# Patient Record
Sex: Male | Born: 1985 | Race: Black or African American | Hispanic: No | Marital: Single | Smoking: Never smoker
Health system: Southern US, Community
[De-identification: ages and names within clinical notes are randomized; demographics above are authoritative.]

## PROBLEM LIST (undated history)

## (undated) DIAGNOSIS — T7840XA Allergy, unspecified, initial encounter: Secondary | ICD-10-CM

## (undated) DIAGNOSIS — J45909 Unspecified asthma, uncomplicated: Secondary | ICD-10-CM

## (undated) DIAGNOSIS — U071 COVID-19: Secondary | ICD-10-CM

---

## 2005-03-01 ENCOUNTER — Ambulatory Visit (HOSPITAL_COMMUNITY): Admission: RE | Admit: 2005-03-01 | Discharge: 2005-03-01 | Payer: Self-pay | Admitting: Nephrology

## 2007-06-06 ENCOUNTER — Emergency Department (HOSPITAL_COMMUNITY): Admission: EM | Admit: 2007-06-06 | Discharge: 2007-06-06 | Payer: Self-pay | Admitting: Emergency Medicine

## 2008-08-05 ENCOUNTER — Emergency Department (HOSPITAL_COMMUNITY): Admission: EM | Admit: 2008-08-05 | Discharge: 2008-08-05 | Payer: Self-pay | Admitting: Emergency Medicine

## 2013-07-12 ENCOUNTER — Emergency Department (HOSPITAL_COMMUNITY): Payer: Self-pay

## 2013-07-12 ENCOUNTER — Encounter (HOSPITAL_COMMUNITY): Payer: Self-pay | Admitting: Adult Health

## 2013-07-12 ENCOUNTER — Emergency Department (HOSPITAL_COMMUNITY)
Admission: EM | Admit: 2013-07-12 | Discharge: 2013-07-12 | Disposition: A | Discharge status: Home / Self Care | Payer: Self-pay | Attending: Emergency Medicine | Admitting: Emergency Medicine

## 2013-07-12 DIAGNOSIS — J45909 Unspecified asthma, uncomplicated: Secondary | ICD-10-CM | POA: Insufficient documentation

## 2013-07-12 DIAGNOSIS — R05 Cough: Secondary | ICD-10-CM | POA: Insufficient documentation

## 2013-07-12 DIAGNOSIS — R0609 Other forms of dyspnea: Secondary | ICD-10-CM | POA: Insufficient documentation

## 2013-07-12 DIAGNOSIS — R0989 Other specified symptoms and signs involving the circulatory and respiratory systems: Secondary | ICD-10-CM | POA: Insufficient documentation

## 2013-07-12 DIAGNOSIS — R0789 Other chest pain: Secondary | ICD-10-CM | POA: Insufficient documentation

## 2013-07-12 DIAGNOSIS — J45901 Unspecified asthma with (acute) exacerbation: Secondary | ICD-10-CM

## 2013-07-12 DIAGNOSIS — R059 Cough, unspecified: Secondary | ICD-10-CM | POA: Insufficient documentation

## 2013-07-12 HISTORY — DX: Unspecified asthma, uncomplicated: J45.909

## 2013-07-12 MED ORDER — PREDNISONE 20 MG PO TABS
60.0000 mg | ORAL_TABLET | Freq: Once | ORAL | Status: AC
  Administered 2013-07-12: 60 mg via ORAL
  Filled 2013-07-12: qty 3

## 2013-07-12 MED ORDER — IPRATROPIUM BROMIDE 0.02 % IN SOLN
RESPIRATORY_TRACT | Status: AC
  Filled 2013-07-12: qty 2.5

## 2013-07-12 MED ORDER — ALBUTEROL SULFATE (5 MG/ML) 0.5% IN NEBU
INHALATION_SOLUTION | RESPIRATORY_TRACT | Status: AC
  Filled 2013-07-12: qty 0.5

## 2013-07-12 MED ORDER — IPRATROPIUM BROMIDE 0.02 % IN SOLN: 0.5000 mg | Freq: Once | RESPIRATORY_TRACT | Status: DC

## 2013-07-12 MED ORDER — PREDNISONE 20 MG PO TABS: ORAL_TABLET | ORAL | Status: DC

## 2013-07-12 MED ORDER — ALBUTEROL SULFATE (5 MG/ML) 0.5% IN NEBU
2.5000 mg | INHALATION_SOLUTION | Freq: Once | RESPIRATORY_TRACT | Status: AC
  Administered 2013-07-12: 2.5 mg via RESPIRATORY_TRACT

## 2013-07-12 MED ORDER — IPRATROPIUM BROMIDE 0.02 % IN SOLN
0.5000 mg | Freq: Once | RESPIRATORY_TRACT | Status: AC
  Administered 2013-07-12: 01:00:00 via RESPIRATORY_TRACT
  Administered 2013-07-12: 0.5 mg via RESPIRATORY_TRACT
  Filled 2013-07-12: qty 2.5

## 2013-07-12 MED ORDER — ALBUTEROL SULFATE (5 MG/ML) 0.5% IN NEBU
2.5000 mg | INHALATION_SOLUTION | Freq: Once | RESPIRATORY_TRACT | Status: AC
  Administered 2013-07-12: 2.5 mg via RESPIRATORY_TRACT
  Filled 2013-07-12: qty 0.5

## 2013-07-12 MED ORDER — ALBUTEROL SULFATE HFA 108 (90 BASE) MCG/ACT IN AERS
2.0000 | INHALATION_SPRAY | RESPIRATORY_TRACT | Status: DC | PRN
  Administered 2013-07-12: 2 via RESPIRATORY_TRACT
  Filled 2013-07-12: qty 6.7

## 2013-07-12 NOTE — ED Provider Notes (Signed)
  CSN: [id number]     Arrival date & time 07/12/13  0025 History     First MD Initiated Contact with Patient 07/12/13 0040     Chief Complaint  Patient presents with  . Asthma   (Consider location/radiation/quality/duration/timing/severity/associated sxs/prior Treatment) Patient is a 27 y.o. male presenting with asthma. The history is provided by the patient.  Asthma  He was walking outside this afternoon when he developed difficulty breathing. He's had a cough since this morning which is productive of green sputum. He denies fever, chills, sweats. There has been some tightness in his chest. Denies nausea or vomiting. He has a history of asthma and had an inhaler but ran out several weeks ago. He has really that he recently moved but at his prior home there is mold in his bedroom.  Past Medical History  Diagnosis Date  . Asthma    History reviewed. No pertinent past surgical history. History reviewed. No pertinent family history. History  Substance Use Topics  . Smoking status: Never Smoker   . Smokeless tobacco: Not on file  . Alcohol Use: No    Review of Systems  All other systems reviewed and are negative.    Allergies  Review of patient's allergies indicates no known allergies.  Home Medications  No current outpatient prescriptions on file. BP 143/96  Pulse 79  Temp(Src) 98.3 F (36.8 C) (Oral)  Resp 28  SpO2 99% Physical Exam  Nursing note and vitals reviewed.  27 year old male, resting comfortably and in no acute distress. Vital signs are significant for hypertension with blood pressure 143/96, and tachypnea with respiratory rate of 28. Oxygen saturation is 99%, which is normal. Head is normocephalic and atraumatic. PERRLA, EOMI. Oropharynx is clear. Neck is nontender and supple without adenopathy or JVD. Back is nontender and there is no CVA tenderness. Lungs have moderate diffuse wheezing without rales or rhonchi. He appears mildly uncomfortable but is not  using accessory muscles of respiration and is able to speak in complete sentences. Chest is nontender. Heart has regular rate and rhythm without murmur. Abdomen is soft, flat, nontender without masses or hepatosplenomegaly and peristalsis is normoactive. Extremities have no cyanosis or edema, full range of motion is present. Skin is warm and dry without rash. Neurologic: Mental status is normal, cranial nerves are intact, there are no motor or sensory deficits.  ED Course   Procedures (including critical care time)  Dg Chest 2 View  07/12/2013   *RADIOLOGY REPORT*  Clinical Data: Asthma attack.  CHEST - 2 VIEW  Comparison: None.  Findings: No significant osseous abnormality.  Lungs are clear. No effusion or pneumothorax.  Cardiomediastinal size and contour are within normal limits.  The upper abdomen is unremarkable.  IMPRESSION: No evidence of acute cardiopulmonary disease.   Original Report Authenticated By: Tiburcio Pea   1. Asthma attack     MDM  Acute exacerbation of asthma which is partly due to medication noncompliance. He is given a dose of prednisone. He had already been given an albuterol with ipratropium nebulizer treatment prior to my seeing him and he states that there has been some improvement with that. A second nebulizer treatment as ordered. Old records are reviewed and it has been 5 years since his last ED visit.  He feels much better after second nebulizer treatment. His discharge with prescriptions for prednisone and he is given an albuterol inhaler to take home.  Dione Booze, MD 07/12/13 0300

## 2013-07-12 NOTE — ED Notes (Signed)
Presents with inspiratory and expiratory wheezes, able to speak in full sentences. Symptoms began at 13:30 this afternoon, pt took one albuterol treatment with relief at 10 pm but symtpms have exacerbated.

## 2015-03-25 ENCOUNTER — Encounter (HOSPITAL_COMMUNITY): Payer: Self-pay | Admitting: Emergency Medicine

## 2015-03-25 ENCOUNTER — Emergency Department (HOSPITAL_COMMUNITY)
Admission: EM | Admit: 2015-03-25 | Discharge: 2015-03-25 | Disposition: A | Discharge status: Home / Self Care | Payer: Self-pay | Source: Home / Self Care | Attending: Family Medicine | Admitting: Family Medicine

## 2015-03-25 DIAGNOSIS — R109 Unspecified abdominal pain: Secondary | ICD-10-CM

## 2015-03-25 LAB — CBC
HEMATOCRIT: 40.7 % (ref 39.0–52.0)
HEMOGLOBIN: 13.6 g/dL (ref 13.0–17.0)
MCH: 31.4 pg (ref 26.0–34.0)
MCHC: 33.4 g/dL (ref 30.0–36.0)
MCV: 94 fL (ref 78.0–100.0)
Platelets: 253 10*3/uL (ref 150–400)
RBC: 4.33 MIL/uL (ref 4.22–5.81)
RDW: 12.8 % (ref 11.5–15.5)
WBC: 7.5 10*3/uL (ref 4.0–10.5)

## 2015-03-25 LAB — COMPREHENSIVE METABOLIC PANEL
ALK PHOS: 75 U/L (ref 39–117)
ALT: 19 U/L (ref 0–53)
AST: 29 U/L (ref 0–37)
Albumin: 3.8 g/dL (ref 3.5–5.2)
Anion gap: 9 (ref 5–15)
BUN: 25 mg/dL — ABNORMAL HIGH (ref 6–23)
CALCIUM: 9.2 mg/dL (ref 8.4–10.5)
CHLORIDE: 101 mmol/L (ref 96–112)
CO2: 30 mmol/L (ref 19–32)
Creatinine, Ser: 0.97 mg/dL (ref 0.50–1.35)
GFR calc Af Amer: >90 mL/min (ref >90)
GLUCOSE: 106 mg/dL — AB (ref 70–99)
POTASSIUM: 4.5 mmol/L (ref 3.5–5.1)
SODIUM: 140 mmol/L (ref 135–145)
Total Bilirubin: 0.5 mg/dL (ref 0.3–1.2)
Total Protein: 7.1 g/dL (ref 6.0–8.3)

## 2015-03-25 LAB — POCT URINALYSIS DIP (DEVICE)
BILIRUBIN URINE: NEGATIVE
GLUCOSE, UA: NEGATIVE mg/dL
Hgb urine dipstick: NEGATIVE
KETONES UR: NEGATIVE mg/dL
LEUKOCYTES UA: NEGATIVE
Nitrite: NEGATIVE
PROTEIN: NEGATIVE mg/dL
SPECIFIC GRAVITY, URINE: 1.02 (ref 1.005–1.030)
Urobilinogen, UA: 2 mg/dL — ABNORMAL HIGH (ref 0.0–1.0)
pH: 7 (ref 5.0–8.0)

## 2015-03-25 LAB — LIPASE, BLOOD: Lipase: 18 U/L (ref 11–59)

## 2015-03-25 NOTE — ED Provider Notes (Signed)
Brady Rivera is a 29 y.o. male who presents to Urgent Care today for flank pain. Patient has bilateral right worse than left side pain for the past week. 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 He does however note urinary frequency especially at night. He denies any abdominal pain fevers or chills. He denies any penile discharge or urinary urgency or dysuria. He has tried some aspirin which helps a little. He feels well otherwise. He denies any injury. He notes the pain radiates to his thoracic posterior and lateral chest wall and is worse with arm motion.   Past Medical History  Diagnosis Date  . Asthma    History reviewed. No pertinent past surgical history. History  Substance Use Topics  . Smoking status: Never Smoker   . Smokeless tobacco: Not on file  . Alcohol Use: No   ROS as above Medications: No current facility-administered medications for this encounter.   Current Outpatient Prescriptions  Medication Sig Dispense Refill  . cetirizine (ZYRTEC) 10 MG tablet Take 10 mg by mouth daily.    . predniSONE (DELTASONE) 20 MG tablet 3 tabs po daily (Patient not taking: Reported on 03/25/2015) 15 tablet 0   No Known Allergies   Exam:  BP 106/84 mmHg  Pulse 70  Temp(Src) 98.1 F (36.7 C) (Oral)  Resp 16  SpO2 100% Gen: Well NAD HEENT: EOMI,  MMM Lungs: Normal work of breathing. CTABL Heart: RRR no MRG Chest wall is nontender Abd: NABS, Soft. Nondistended, Nontender no rebound or guarding. No CV angle tenderness to percussion Exts: Brisk capillary refill, warm and well perfused.   Results for orders placed or performed during the hospital encounter of 03/25/15 (from the past 24 hour(s))  POCT urinalysis dip (device)     Status: Abnormal   Collection Time: 03/25/15  8:36 AM  Result Value Ref Range   Glucose, UA NEGATIVE NEGATIVE mg/dL   Bilirubin Urine NEGATIVE NEGATIVE   Ketones, ur NEGATIVE NEGATIVE mg/dL   Specific Gravity, Urine 1.020 1.005 - 1.030    Hgb urine dipstick NEGATIVE NEGATIVE   pH 7.0 5.0 - 8.0   Protein, ur NEGATIVE NEGATIVE mg/dL   Urobilinogen, UA 2.0 (H) 0.0 - 1.0 mg/dL   Nitrite NEGATIVE NEGATIVE   Leukocytes, UA NEGATIVE NEGATIVE  CBC     Status: None   Collection Time: 03/25/15  9:20 AM  Result Value Ref Range   WBC 7.5 4.0 - 10.5 K/uL   RBC 4.33 4.22 - 5.81 MIL/uL   Hemoglobin 13.6 13.0 - 17.0 g/dL   HCT 16.1 09.6 - 04.5 %   MCV 94.0 78.0 - 100.0 fL   MCH 31.4 26.0 - 34.0 pg   MCHC 33.4 30.0 - 36.0 g/dL   RDW 40.9 81.1 - 91.4 %   Platelets 253 150 - 400 K/uL  Comprehensive metabolic panel     Status: Abnormal   Collection Time: 03/25/15  9:20 AM  Result Value Ref Range   Sodium 140 135 - 145 mmol/L   Potassium 4.5 3.5 - 5.1 mmol/L   Chloride 101 96 - 112 mmol/L   CO2 30 19 - 32 mmol/L   Glucose, Bld 106 (H) 70 - 99 mg/dL   BUN 25 (H) 6 - 23 mg/dL   Creatinine, Ser 7.82 0.50 - 1.35 mg/dL   Calcium 9.2 8.4 - 95.6 mg/dL   Total Protein 7.1 6.0 - 8.3 g/dL   Albumin 3.8 3.5 - 5.2 g/dL   AST 29  0 - 37 U/L   ALT 19 0 - 53 U/L   Alkaline Phosphatase 75 39 - 117 U/L   Total Bilirubin 0.5 0.3 - 1.2 mg/dL   GFR calc non Af Amer >90 >90 mL/min   GFR calc Af Amer >90 >90 mL/min   Anion gap 9 5 - 15  Lipase, blood     Status: None   Collection Time: 03/25/15  9:20 AM  Result Value Ref Range   Lipase 18 11 - 59 U/L   No results found.  Assessment and Plan: 29 y.o. male with probable strain of the anterior serratus bilaterally with start of a new job. However patient does have mild increase of urobilinogen on urinalysis. I am quite doubtful of serious abdominal pathology however will obtain a CBC, CMP, and lipase. Patient was discharged prior to labs being back. I have called his cell phone twice and left a message asking for call back.  Will call in voltaren and flexeril.    Discussed warning signs or symptoms. Please see discharge instructions. Patient expresses understanding.     Rodolph BongEvan S Corey,  MD 03/25/15 1059

## 2015-03-25 NOTE — Discharge Instructions (Signed)
Thank you for coming in today. I will call you in a few hours with test results and the plan.  If your belly pain worsens, or you have high fever, bad vomiting, blood in your stool or black tarry stool go to the Emergency Room.   Muscle Strain A muscle strain is an injury that occurs when a muscle is stretched beyond its normal length. Usually a small number of muscle fibers are torn when this happens. Muscle strain is rated in degrees. First-degree strains have the least amount of muscle fiber tearing and pain. Second-degree and third-degree strains have increasingly more tearing and pain.  Usually, recovery from muscle strain takes 1-2 weeks. Complete healing takes 5-6 weeks.  CAUSES  Muscle strain happens when a sudden, violent force placed on a muscle stretches it too far. This may occur with lifting, sports, or a fall.  RISK FACTORS Muscle strain is especially common in athletes.  SIGNS AND SYMPTOMS At the site of the muscle strain, there may be:  Pain.  Bruising.  Swelling.  Difficulty using the muscle due to pain or lack of normal function. DIAGNOSIS  Your health care provider will perform a physical exam and ask about your medical history. TREATMENT  Often, the best treatment for a muscle strain is resting, icing, and applying cold compresses to the injured area.  HOME CARE INSTRUCTIONS   Use the PRICE method of treatment to promote muscle healing during the first 2-3 days after your injury. The PRICE method involves:  Protecting the muscle from being injured again.  Restricting your activity and resting the injured body part.  Icing your injury. To do this, put ice in a plastic bag. Place a towel between your skin and the bag. Then, apply the ice and leave it on from 15-20 minutes each hour. After the third day, switch to moist heat packs.  Apply compression to the injured area with a splint or elastic bandage. Be careful not to wrap it too tightly. This may interfere  with blood circulation or increase swelling.  Elevate the injured body part above the level of your heart as often as you can.  Only take over-the-counter or prescription medicines for pain, discomfort, or fever as directed by your health care provider.  Warming up prior to exercise helps to prevent future muscle strains. SEEK MEDICAL CARE IF:   You have increasing pain or swelling in the injured area.  You have numbness, tingling, or a significant loss of strength in the injured area. MAKE SURE YOU:   Understand these instructions.  Will watch your condition.  Will get help right away if you are not doing well or get worse. Document Released: 11/27/2005 Document Revised: 09/17/2013 Document Reviewed: 06/26/2013 [place] [facility] Patient Information 2015 [place], [place]. This information is not intended to replace advice given to you by your health care provider. Make sure you discuss any questions you have with your health care provider.

## 2015-03-25 NOTE — ED Notes (Signed)
Bilateral flank pain for a week, pain is greater on right side than left side.  Describes pain as sharp.  Reports frequent urination, denies penile discharge, denies pain with urination.

## 2015-03-26 ENCOUNTER — Telehealth (HOSPITAL_COMMUNITY): Payer: Self-pay | Admitting: Family Medicine

## 2015-03-26 MED ORDER — CYCLOBENZAPRINE HCL 10 MG PO TABS: 10.0000 mg | ORAL_TABLET | Freq: Every evening | ORAL | Status: DC | PRN

## 2015-03-26 MED ORDER — DICLOFENAC SODIUM 75 MG PO TBEC: 75.0000 mg | DELAYED_RELEASE_TABLET | Freq: Two times a day (BID) | ORAL | Status: DC | PRN

## 2015-03-26 NOTE — ED Notes (Signed)
Patient called back.  I gave results.  Rx sent in.   Rodolph BongEvan S Jimy Gates, MD 03/26/15 [id number]

## 2015-04-22 ENCOUNTER — Emergency Department (HOSPITAL_COMMUNITY): Payer: Worker's Compensation

## 2015-04-22 ENCOUNTER — Emergency Department (HOSPITAL_COMMUNITY)
Admission: EM | Admit: 2015-04-22 | Discharge: 2015-04-22 | Disposition: A | Discharge status: Home / Self Care | Payer: Worker's Compensation | Attending: Emergency Medicine | Admitting: Emergency Medicine

## 2015-04-22 ENCOUNTER — Encounter (HOSPITAL_COMMUNITY): Payer: Self-pay | Admitting: Emergency Medicine

## 2015-04-22 DIAGNOSIS — Y939 Activity, unspecified: Secondary | ICD-10-CM | POA: Insufficient documentation

## 2015-04-22 DIAGNOSIS — J45909 Unspecified asthma, uncomplicated: Secondary | ICD-10-CM | POA: Insufficient documentation

## 2015-04-22 DIAGNOSIS — S8992XA Unspecified injury of left lower leg, initial encounter: Secondary | ICD-10-CM | POA: Diagnosis present

## 2015-04-22 DIAGNOSIS — Y929 Unspecified place or not applicable: Secondary | ICD-10-CM | POA: Insufficient documentation

## 2015-04-22 DIAGNOSIS — Y990 Civilian activity done for income or pay: Secondary | ICD-10-CM | POA: Insufficient documentation

## 2015-04-22 DIAGNOSIS — W010XXA Fall on same level from slipping, tripping and stumbling without subsequent striking against object, initial encounter: Secondary | ICD-10-CM | POA: Insufficient documentation

## 2015-04-22 DIAGNOSIS — Z79899 Other long term (current) drug therapy: Secondary | ICD-10-CM | POA: Diagnosis not present

## 2015-04-22 DIAGNOSIS — M25562 Pain in left knee: Secondary | ICD-10-CM

## 2015-04-22 MED ORDER — HYDROCODONE-ACETAMINOPHEN 5-325 MG PO TABS: 1.0000 | ORAL_TABLET | Freq: Four times a day (QID) | ORAL | Status: DC | PRN

## 2015-04-22 NOTE — ED Notes (Signed)
Pt st's he slipped and fell at work landing on left knee approx 45 min. Ago.  C/o pain to left knee

## 2015-04-22 NOTE — Discharge Instructions (Signed)
Crutch Use Crutches take weight off one of your legs or feet when you stand or walk. It is important to use crutches that fit right. Your crutches fit right if:  You can fit 2-3 fingers between your armpit and the crutch.  You use your hands, not your armpits, to hold yourself up. Do not put your armpits on the crutches. This can damage the nerves in your hands and arms. Crutches should be a little below your armpits. HOW TO USE YOUR CRUTCHES Walking 1. Step with the crutches. 2. Swing the good leg a little bit in front of the crutches. Going Up Steps If there is no handrail: 1. Step up with the good leg. 2. Step up with the crutches and hurt leg. 3. Continue in this way. If there is a handrail: 1. Hold both crutches in one hand. 2. Place your free hand on the handrail. 3. Put your weight on your arms and lift your good leg to the step. 4. Bring the crutches and the hurt leg up to that step. 5. Continue in this way. Going Down Steps Be very careful, as going down stairs with crutches is very challenging. If there is no handrail: 1. Step down with the hurt leg and crutches. 2. Step down with the good leg. If there is a handrail: 1. Place your hand on the handrail. 2. Hold both crutches with your free hand. 3. Lower your hurt leg and crutch to the step below you. Make sure to keep the crutch tips in the center of the step, never on the edge. 4. Lower your good leg to that step. 5. Continue in this way. Standing Up 1. Hold the hurt leg forward. 2. Grab the armrest with one hand and the top of the crutches with the other hand. 3. Pull yourself up to a standing position. Sitting Down 1. Hold the hurt leg forward. 2. Grab the armrest with one hand and the top of the crutches with the other hand. 3.  Lower yourself to a sitting position. GET HELP IF:  You still feel wobbly on your feet.  You develop new pain, for example in your armpits, back, shoulder, wrist, or hip.  You  cannot feel a part of your body (numb).  You have tingling. GET HELP RIGHT AWAY IF: You fall. Document Released: 05/15/2008 Document Revised: 09/17/2013 Document Reviewed: 08/04/2013 [facility] CenterExitCare Patient Information 2015 [place], [place]. This information is not intended to replace advice given to you by your health care provider. Make sure you discuss any questions you have with your health care provider. Knee Pain The knee is the complex joint between your thigh and your lower leg. It is made up of bones, tendons, ligaments, and cartilage. The bones that make up the knee are:  The femur in the thigh.  The tibia and fibula in the lower leg.  The patella or kneecap riding in the groove on the lower femur. CAUSES  Knee pain is a common complaint with many causes. A few of these causes are: 3. Injury, such as: 1. A ruptured ligament or tendon injury. 2. Torn cartilage. 4. Medical conditions, such as: 1. Gout 2. Arthritis 3. Infections 5. Overuse, over training, or overdoing a physical activity. Knee pain can be minor or severe. Knee pain can accompany debilitating injury. Minor knee problems often respond well to self-care measures or get well on their own. More serious injuries may need medical intervention or even surgery. SYMPTOMS The knee is complex. Symptoms of knee  problems can vary widely. Some of the problems are: 4. Pain with movement and weight bearing. 5. Swelling and tenderness. 6. Buckling of the knee. 7. Inability to straighten or extend your knee. 8. Your knee locks and you cannot straighten it. 9. Warmth and redness with pain and fever. 10. Deformity or dislocation of the kneecap. DIAGNOSIS  Determining what is wrong may be very straight forward such as when there is an injury. It can also be challenging because of the complexity of the knee. Tests to make a diagnosis may include: 6. Your caregiver taking a history and doing a physical exam. 7. Routine X-rays can be  used to rule out other problems. X-rays will not reveal a cartilage tear. Some injuries of the knee can be diagnosed by: 1. Arthroscopy a surgical technique by which a small video camera is inserted through tiny incisions on the sides of the knee. This procedure is used to examine and repair internal knee joint problems. Tiny instruments can be used during arthroscopy to repair the torn knee cartilage (meniscus). 2. Arthrography is a radiology technique. A contrast liquid is directly injected into the knee joint. Internal structures of the knee joint then become visible on X-ray film. 3. An MRI scan is a non X-ray radiology procedure in which magnetic fields and a computer produce two- or three-dimensional images of the inside of the knee. Cartilage tears are often visible using an MRI scanner. MRI scans have largely replaced arthrography in diagnosing cartilage tears of the knee. 8. Blood work. 9. Examination of the fluid that helps to lubricate the knee joint (synovial fluid). This is done by taking a sample out using a needle and a syringe. TREATMENT The treatment of knee problems depends on the cause. Some of these treatments are: 3. Depending on the injury, proper casting, splinting, surgery, or physical therapy care will be needed. 4. Give yourself adequate recovery time. Do not overuse your joints. If you begin to get sore during workout routines, back off. Slow down or do fewer repetitions. 5. For repetitive activities such as cycling or running, maintain your strength and nutrition. 6. Alternate muscle groups. For example, if you are a weight lifter, work the upper body on one day and the lower body the next. 7. Either tight or weak muscles do not give the proper support for your knee. Tight or weak muscles do not absorb the stress placed on the knee joint. Keep the muscles surrounding the knee strong. 8. Take care of mechanical problems. 1. If you have flat feet, orthotics or special shoes  may help. See your caregiver if you need help. 2. Arch supports, sometimes with wedges on the inner or outer aspect of the heel, can help. These can shift pressure away from the side of the knee most bothered by osteoarthritis. 3. A brace called an "unloader" brace also may be used to help ease the pressure on the most arthritic side of the knee. 9. If your caregiver has prescribed crutches, braces, wraps or ice, use as directed. The acronym for this is PRICE. This means protection, rest, ice, compression, and elevation. 10. Nonsteroidal anti-inflammatory drugs (NSAIDs), can help relieve pain. But if taken immediately after an injury, they may actually increase swelling. Take NSAIDs with food in your stomach. Stop them if you develop stomach problems. Do not take these if you have a history of ulcers, stomach pain, or bleeding from the bowel. Do not take without your caregiver's approval if you have problems with fluid  retention, heart failure, or kidney problems. 11. For ongoing knee problems, physical therapy may be helpful. 12. Glucosamine and chondroitin are over-the-counter dietary supplements. Both may help relieve the pain of osteoarthritis in the knee. These medicines are different from the usual anti-inflammatory drugs. Glucosamine may decrease the rate of cartilage destruction. 13. Injections of a corticosteroid drug into your knee joint may help reduce the symptoms of an arthritis flare-up. They may provide pain relief that lasts a few months. You may have to wait a few months between injections. The injections do have a small increased risk of infection, water retention, and elevated blood sugar levels. 14. Hyaluronic acid injected into damaged joints may ease pain and provide lubrication. These injections may work by reducing inflammation. A series of shots may give relief for as long as 6 months. 15. Topical painkillers. Applying certain ointments to your skin may help relieve the pain and  stiffness of osteoarthritis. Ask your pharmacist for suggestions. Many over the-counter products are approved for temporary relief of arthritis pain. 16. In some countries, doctors often prescribe topical NSAIDs for relief of chronic conditions such as arthritis and tendinitis. A review of treatment with NSAID creams found that they worked as well as oral medications but without the serious side effects. PREVENTION 6. Maintain a healthy weight. Extra pounds put more strain on your joints. 7. Get strong, stay limber. Weak muscles are a common cause of knee injuries. Stretching is important. Include flexibility exercises in your workouts. 8. Be smart about exercise. If you have osteoarthritis, chronic knee pain or recurring injuries, you may need to change the way you exercise. This does not mean you have to stop being active. If your knees ache after jogging or playing basketball, consider switching to swimming, water aerobics, or other low-impact activities, at least for a few days a week. Sometimes limiting high-impact activities will provide relief. 9. Make sure your shoes fit well. Choose footwear that is right for your sport. 10. Protect your knees. Use the proper gear for knee-sensitive activities. Use kneepads when playing volleyball or laying carpet. Buckle your seat belt every time you drive. Most shattered kneecaps occur in car accidents. 11. Rest when you are tired. SEEK MEDICAL CARE IF:  You have knee pain that is continual and does not seem to be getting better.  SEEK IMMEDIATE MEDICAL CARE IF:  Your knee joint feels hot to the touch and you have a high fever. MAKE SURE YOU:  4. Understand these instructions. 5. Will watch your condition. 6. Will get help right away if you are not doing well or get worse. Document Released: 09/24/2007 Document Revised: 02/19/2012 Document Reviewed: 09/24/2007 [facility] LLCExitCare Patient Information 2015 [place]ExitCare, [place]LLC. This information is not intended to replace  advice given to you by your health care provider. Make sure you discuss any questions you have with your health care provider.

## 2015-04-22 NOTE — ED Provider Notes (Signed)
CSN: [id number]     Arrival date & time 04/22/15  1900 History  This chart was scribed for non-physician practitioner, Felicie Mornavid Bernardina Cacho, NP, working with Linwood DibblesJon Knapp, MD, by Bronson CurbJacqueline Melvin, ED Scribe. This patient was seen in room TR06C/TR06C and the patient's care was started at 7:26 PM.     Chief Complaint  Patient presents with  . Knee Pain    Patient is a 29 y.o. male presenting with knee pain. The history is provided by the patient. No language interpreter was used.  Knee Pain Location:  Knee Time since incident:  45 minutes Injury: no   Knee location:  L knee Pain details:    Radiates to:  Does not radiate   Pain severity now: 3/10.   Onset quality:  Sudden   Duration: 45 minutes.   Timing:  Constant   Progression:  Unchanged Chronicity:  New Prior injury to area:  Yes Relieved by:  None tried Exacerbated by: applying pressure. Ineffective treatments:  None tried Associated symptoms: no fever      HPI Comments: Brady Rivera is a 29 y.o. male who presents to the Emergency Department complaining of sudden onset, constant, 3/10, left knee pain that began approximately 45 minutes ago. Patient states he slipped and fell at work, twisting the left knee. He reports the pain is worse with application of pressure. Patient reports history of prior dislocation of the same knee. He denies BLE pain, hip pain, or any other injuries. He further denies, fever, chills, cough, congestion, rhinorrhea, sore throat, abdominal pain, nausea, chest pain, or any other symptoms at this time.   Past Medical History  Diagnosis Date  . Asthma    History reviewed. No pertinent past surgical history. No family history on file. History  Substance Use Topics  . Smoking status: Never Smoker   . Smokeless tobacco: Not on file  . Alcohol Use: No    Review of Systems  Constitutional: Negative for fever and chills.  HENT: Negative for congestion, rhinorrhea and sore throat.   Respiratory: Negative  for cough.   Cardiovascular: Negative for chest pain.  Gastrointestinal: Negative for abdominal pain.  Musculoskeletal: Positive for arthralgias.  All other systems reviewed and are negative.     Allergies  Review of patient's allergies indicates no known allergies.  Home Medications   Prior to Admission medications   Medication Sig Start Date End Date Taking? Authorizing Provider  cetirizine (ZYRTEC) 10 MG tablet Take 10 mg by mouth daily.    Historical Provider, MD  cyclobenzaprine (FLEXERIL) 10 MG tablet Take 1 tablet (10 mg total) by mouth at bedtime as needed for muscle spasms. 03/26/15   Rodolph BongEvan S Corey, MD  diclofenac (VOLTAREN) 75 MG EC tablet Take 1 tablet (75 mg total) by mouth 2 (two) times daily as needed. 03/26/15   Rodolph BongEvan S Corey, MD  predniSONE (DELTASONE) 20 MG tablet 3 tabs po daily Patient not taking: Reported on 03/25/2015 07/12/13   Dione Boozeavid Glick, MD   Triage Vitals: BP 133/84 mmHg  Pulse 74  Temp(Src) 98.3 F (36.8 C) (Oral)  Resp 18  Ht 5\' 10"  (1.778 m)  Wt 131 lb (59.421 kg)  BMI 18.80 kg/m2  SpO2 100%  Physical Exam  Constitutional: He is oriented to person, place, and time. He appears well-developed and well-nourished. No distress.  HENT:  Head: Normocephalic and atraumatic.  Eyes: Conjunctivae and EOM are normal.  Neck: Neck supple. No tracheal deviation present.  Cardiovascular: Normal rate.   Pulmonary/Chest: Effort  normal. No respiratory distress.  Musculoskeletal: Normal range of motion. He exhibits tenderness.  Medial tenderness to the left knee.  Neurological: He is alert and oriented to person, place, and time.  Skin: Skin is warm and dry.  Psychiatric: He has a normal mood and affect. His behavior is normal.  Nursing note and vitals reviewed.   ED Course  Procedures (including critical care time)  DIAGNOSTIC STUDIES: Oxygen Saturation is 100% on room air, normal by my interpretation.    COORDINATION OF CARE: At 1930 Discussed treatment plan  with patient which includes imaging. Patient agrees.   Labs Review Labs Reviewed - No data to display  Imaging Review Dg Knee Complete 4 Views Left  04/22/2015   CLINICAL DATA:  Slipping injury with knee pain, initial encounter  EXAM: LEFT KNEE - COMPLETE 4+ VIEW  COMPARISON:  None.  FINDINGS: Moderate joint effusion is noted. There is a triangular-shaped bony density noted just posterior to the superior aspect of the patella consistent with a focal fracture. The actual donor site is not well appreciated on these images but likely arises from the posterior aspect of the patella. No other focal abnormality is seen.  IMPRESSION: Fracture fragment posterior to the superior aspect of the patella. Moderate joint effusion is noted. The donor site is not well visualized although likely is arising from the posterior patella.   Electronically Signed   By: Alcide CleverMark  Lukens M.D.   On: 04/22/2015 20:01     EKG Interpretation None     Radiology results reviewed and shared with patient. MDM   Final diagnoses:  None    Knee pain. ? Patellar fracture, posterior origin. Rest. Ice. Anti-inflammatory. Knee immobilizer. Crutches. Follow-up with orthopedics.    I personally performed the services described in this documentation, which was scribed in my presence. The recorded information has been reviewed and is accurate.     Felicie Mornavid Esteen Delpriore, NP 04/22/15 [phone]  Linwood DibblesJon Knapp, MD 04/25/15 [phone]

## 2016-07-06 ENCOUNTER — Ambulatory Visit (HOSPITAL_COMMUNITY)
Admission: EM | Admit: 2016-07-06 | Discharge: 2016-07-06 | Disposition: A | Discharge status: Home / Self Care | Payer: Self-pay | Attending: Family Medicine | Admitting: Family Medicine

## 2016-07-06 ENCOUNTER — Encounter (HOSPITAL_COMMUNITY): Payer: Self-pay | Admitting: Emergency Medicine

## 2016-07-06 DIAGNOSIS — S68119A Complete traumatic metacarpophalangeal amputation of unspecified finger, initial encounter: Secondary | ICD-10-CM

## 2016-07-06 DIAGNOSIS — S68129A Partial traumatic metacarpophalangeal amputation of unspecified finger, initial encounter: Secondary | ICD-10-CM

## 2016-07-06 MED ORDER — HYDROCODONE-ACETAMINOPHEN 5-325 MG PO TABS
ORAL_TABLET | ORAL | Status: AC
  Filled 2016-07-06: qty 1

## 2016-07-06 MED ORDER — HYDROCODONE-ACETAMINOPHEN 5-325 MG PO TABS
1.0000 | ORAL_TABLET | Freq: Once | ORAL | Status: AC
  Administered 2016-07-06: 1 via ORAL

## 2016-07-06 MED ORDER — SODIUM CHLORIDE 0.9% FLUSH
INTRAVENOUS | Status: AC
  Filled 2016-07-06: qty 10

## 2016-07-06 MED ORDER — HYDROCODONE-ACETAMINOPHEN 5-325 MG PO TABS
2.0000 | ORAL_TABLET | ORAL | 0 refills | Status: DC | PRN
Start: 2016-07-06 — End: 2022-09-06

## 2016-07-06 MED ORDER — TETANUS-DIPHTH-ACELL PERTUSSIS 5-2.5-18.5 LF-MCG/0.5 IM SUSP
INTRAMUSCULAR | Status: AC
  Filled 2016-07-06: qty 0.5

## 2016-07-06 MED ORDER — CEPHALEXIN 500 MG PO CAPS: 500.0000 mg | ORAL_CAPSULE | Freq: Three times a day (TID) | ORAL | 0 refills | Status: DC

## 2016-07-06 MED ORDER — TETANUS-DIPHTH-ACELL PERTUSSIS 5-2.5-18.5 LF-MCG/0.5 IM SUSP
0.5000 mL | Freq: Once | INTRAMUSCULAR | Status: AC
  Administered 2016-07-06: 0.5 mL via INTRAMUSCULAR

## 2016-07-06 NOTE — ED Provider Notes (Signed)
CSN: [id number]     Arrival date & time 07/06/16  1342 History   None    Chief Complaint  Patient presents with  . Laceration    left thumb   (Consider location/radiation/quality/duration/timing/severity/associated sxs/prior Treatment) Patient was cutting collard greens and cut the tip of his left thumb off.   The history is provided by the patient.  Laceration  Location:  Finger Finger laceration location:  L thumb Length:  1 cm Depth:  Through underlying tissue Quality: avulsion   Bleeding: venous and controlled with pressure   Time since incident:  1 hour Laceration mechanism:  Knife Pain details:    Quality:  Sharp   Severity:  Severe   Timing:  Constant   Progression:  Unchanged Foreign body present:  No foreign bodies Relieved by:  Nothing Worsened by:  Nothing Ineffective treatments:  None tried Tetanus status:  Out of date   Past Medical History:  Diagnosis Date  . Asthma    History reviewed. No pertinent surgical history. No family history on file. Social History  Substance Use Topics  . Smoking status: Never Smoker  . Smokeless tobacco: Never Used  . Alcohol use No    Review of Systems  Constitutional: Negative.   HENT: Negative.   Eyes: Negative.   Respiratory: Negative.   Cardiovascular: Negative.   Gastrointestinal: Negative.   Endocrine: Negative.   Genitourinary: Negative.   Musculoskeletal: Negative.        Tip of left thumb avulsed/amputated off  Skin:       Avulsion injury left thumb  Allergic/Immunologic: Negative.   Neurological: Negative.   Hematological: Negative.   Psychiatric/Behavioral: Negative.     Allergies  Review of patient's allergies indicates no known allergies.  Home Medications   Prior to Admission medications   Medication Sig Start Date End Date Taking? Authorizing Provider  cephALEXin (KEFLEX) 500 MG capsule Take 1 capsule (500 mg total) by mouth 3 (three) times daily. 07/06/16   Deatra Canter, FNP   cetirizine (ZYRTEC) 10 MG tablet Take 10 mg by mouth daily.    Historical Provider, MD  cyclobenzaprine (FLEXERIL) 10 MG tablet Take 1 tablet (10 mg total) by mouth at bedtime as needed for muscle spasms. 03/26/15   Rodolph Bong, MD  diclofenac (VOLTAREN) 75 MG EC tablet Take 1 tablet (75 mg total) by mouth 2 (two) times daily as needed. 03/26/15   Rodolph Bong, MD  HYDROcodone-acetaminophen (NORCO/VICODIN) 5-325 MG per tablet Take 1 tablet by mouth every 6 (six) hours as needed for severe pain. 04/22/15   Felicie Morn, NP  HYDROcodone-acetaminophen (NORCO/VICODIN) 5-325 MG tablet Take 2 tablets by mouth every 4 (four) hours as needed. 07/06/16   Deatra Canter, FNP  predniSONE (DELTASONE) 20 MG tablet 3 tabs po daily Patient not taking: Reported on 03/25/2015 07/12/13   Dione Booze, MD   Meds Ordered and Administered this Visit   Medications  HYDROcodone-acetaminophen (NORCO/VICODIN) 5-325 MG per tablet 1 tablet (1 tablet Oral Given 07/06/16 1446)  Tdap (BOOSTRIX) injection 0.5 mL (0.5 mLs Intramuscular Given 07/06/16 1514)    BP 144/97 (BP Location: Right Arm)   Pulse 89   Temp 97.7 F (36.5 C) (Oral)   Resp 17   SpO2 100%  No data found.   Physical Exam  Constitutional: He appears well-developed and well-nourished.  HENT:  Head: Normocephalic and atraumatic.  Eyes: EOM are normal. Pupils are equal, round, and reactive to light.  Neck: Normal range of motion. Neck  supple.  Cardiovascular: Normal rate, regular rhythm and normal heart sounds.   Pulmonary/Chest: Effort normal and breath sounds normal.  Musculoskeletal: He exhibits tenderness.  Left thumb tip is avulsed off and bleeding  Skin:  Left thumb tip avulsed off approx 1 cm diameter wound involving part of the finger nail.  No tendon or bone involvement.  Nursing note and vitals reviewed.   Urgent Care Course   Clinical Course    Procedures (including critical care time)  Labs Review Labs Reviewed - No data to  display  Imaging Review No results found.   Visual Acuity Review  Right Eye Distance:   Left Eye Distance:   Bilateral Distance:    Right Eye Near:   Left Eye Near:    Bilateral Near:         MDM   1. Fingertip amputation, initial encounter    Tip of left thumb is avulsed, amputated.  The bleeding is stopped with tourniquet and silver nitrate and surgiceal dressing.  Norco /325mg  given here in clinic.  Digital block using bupivacaine used and finger tip wound bleeders stopped with silver nitrate stix.    Keflex  one po tid x 10 days, Norco 5/325 one po q 6 hours prn #6. Dressing change qd with neosporin ointment.  Keep wound clean for next week until thumb wound drys up and scabs over.    Deatra Canter, FNP 07/06/16 1524

## 2016-07-06 NOTE — ED Triage Notes (Signed)
Pt. Stated, I was cutting up collard greens and cut my left thumb with a knife.

## 2016-07-10 ENCOUNTER — Ambulatory Visit (HOSPITAL_COMMUNITY)
Admission: EM | Admit: 2016-07-10 | Discharge: 2016-07-10 | Disposition: A | Discharge status: Home / Self Care | Payer: Self-pay | Attending: Family Medicine | Admitting: Family Medicine

## 2016-07-10 ENCOUNTER — Encounter (HOSPITAL_COMMUNITY): Payer: Self-pay | Admitting: Emergency Medicine

## 2016-07-10 DIAGNOSIS — S61209A Unspecified open wound of unspecified finger without damage to nail, initial encounter: Secondary | ICD-10-CM

## 2016-07-10 MED ORDER — HYDROCODONE-ACETAMINOPHEN 5-325 MG PO TABS: 1.0000 | ORAL_TABLET | Freq: Four times a day (QID) | ORAL | 0 refills | Status: DC | PRN

## 2016-07-10 MED ORDER — NAPROXEN 500 MG PO TABS: 500.0000 mg | ORAL_TABLET | Freq: Two times a day (BID) | ORAL | 0 refills | Status: DC

## 2016-07-10 NOTE — ED Triage Notes (Signed)
Patient report he is out of pain medicine and he is still having pain

## 2016-07-10 NOTE — ED Provider Notes (Signed)
CSN: [id number]     Arrival date & time 07/10/16  1516 History   First MD Initiated Contact with Patient 07/10/16 1627     Chief Complaint  Patient presents with  . Medication Refill   (Consider location/radiation/quality/duration/timing/severity/associated sxs/prior Treatment) Patient is here for follow up on his right finger avulsion.  He has not changed his dressing. He was seen a few days ago for avulsion of the tip of his finger and was rx'd pain meds and abx's.   The history is provided by the patient.  Medication Refill  Medications/supplies requested:  Norco Reason for request:  Medications ran out Medications taken before: yes - see home medications   Patient has complete original prescription information: yes     Past Medical History:  Diagnosis Date  . Asthma    History reviewed. No pertinent surgical history. No family history on file. Social History  Substance Use Topics  . Smoking status: Never Smoker  . Smokeless tobacco: Never Used  . Alcohol use No    Review of Systems  Constitutional: Negative.   HENT: Negative.   Eyes: Negative.   Respiratory: Negative.   Cardiovascular: Negative.   Gastrointestinal: Negative.   Endocrine: Negative.   Genitourinary: Negative.   Musculoskeletal: Negative.   Skin: Positive for wound.  Allergic/Immunologic: Negative.   Neurological: Negative.   Hematological: Negative.   Psychiatric/Behavioral: Negative.     Allergies  Review of patient's allergies indicates no known allergies.  Home Medications   Prior to Admission medications   Medication Sig Start Date End Date Taking? Authorizing Provider  cephALEXin (KEFLEX) 500 MG capsule Take 1 capsule (500 mg total) by mouth 3 (three) times daily. 07/06/16   Deatra Canter, FNP  cetirizine (ZYRTEC) 10 MG tablet Take 10 mg by mouth daily.    Historical Provider, MD  cyclobenzaprine (FLEXERIL) 10 MG tablet Take 1 tablet (10 mg total) by mouth at bedtime as needed for  muscle spasms. 03/26/15   Rodolph Bong, MD  diclofenac (VOLTAREN) 75 MG EC tablet Take 1 tablet (75 mg total) by mouth 2 (two) times daily as needed. 03/26/15   Rodolph Bong, MD  HYDROcodone-acetaminophen (NORCO/VICODIN) 5-325 MG tablet Take 2 tablets by mouth every 4 (four) hours as needed. 07/06/16   Deatra Canter, FNP  HYDROcodone-acetaminophen (NORCO/VICODIN) 5-325 MG tablet Take 1 tablet by mouth every 6 (six) hours as needed for severe pain. 07/10/16   Deatra Canter, FNP  naproxen (NAPROSYN) 500 MG tablet Take 1 tablet (500 mg total) by mouth 2 (two) times daily with a meal. 07/10/16   Deatra Canter, FNP  predniSONE (DELTASONE) 20 MG tablet 3 tabs po daily Patient not taking: Reported on 03/25/2015 07/12/13   Dione Booze, MD   Meds Ordered and Administered this Visit  Medications - No data to display  BP 144/93 (BP Location: Left Arm)   Pulse 68   Temp 98.3 F (36.8 C) (Oral)   Resp 12   SpO2 100%  No data found.   Physical Exam  Constitutional: He appears well-developed and well-nourished.  HENT:  Head: Normocephalic and atraumatic.  Eyes: Conjunctivae and EOM are normal. Pupils are equal, round, and reactive to light.  Neck: Normal range of motion. Neck supple.  Cardiovascular: Normal rate, regular rhythm and normal heart sounds.   Pulmonary/Chest: Effort normal and breath sounds normal.  Skin:  Right index finger dressing removed and right fingertip is healing well.    Nursing note and vitals reviewed.  Urgent Care Course   Clinical Course    Procedures (including critical care time)  Labs Review Labs Reviewed - No data to display  Imaging Review No results found.   Visual Acuity Review  Right Eye Distance:   Left Eye Distance:   Bilateral Distance:    Right Eye Near:   Left Eye Near:    Bilateral Near:         MDM   1. Avulsion, finger tip, initial encounter    Patient instructed to change dressing qd and apply neosporin ointment  qd. Continue with abx's and rx'd norco 5mg /325mg  one po q 6 hours prn pain #10. Naprosyn 500mg  one po bid x 10 days #20 Non adaptic dressing and 4x4 taped with coban.    Deatra Canter, FNP 07/10/16 [phone]

## 2017-08-01 ENCOUNTER — Other Ambulatory Visit: Payer: Self-pay | Admitting: Physician Assistant

## 2017-08-01 ENCOUNTER — Ambulatory Visit (INDEPENDENT_AMBULATORY_CARE_PROVIDER_SITE_OTHER): Payer: Self-pay

## 2017-08-01 ENCOUNTER — Ambulatory Visit (INDEPENDENT_AMBULATORY_CARE_PROVIDER_SITE_OTHER): Payer: Worker's Compensation | Admitting: Physician Assistant

## 2017-08-01 VITALS — BP 142/97 | HR 96 | Temp 97.9°F

## 2017-08-01 DIAGNOSIS — S61218A Laceration without foreign body of other finger without damage to nail, initial encounter: Secondary | ICD-10-CM

## 2017-08-01 NOTE — Patient Instructions (Signed)

## 2017-08-01 NOTE — Progress Notes (Signed)
   Brady Rivera  MRN: [id number] DOB: [birth year]  Subjective:  Brady Rivera is a 31 y.o. male seen in office today for a chief complaint of injury to left index finger an hour before arrival. He was at work when he accidentally slammed it between a drawer and a door. He had associated laceration which has been bleeding since the injury. Denies numbness, tingling, and loss of ROM. He is not on an anticoagulation. Last received Tdap vaccine on 07/06/16.  Review of Systems  Constitutional: Negative for chills, diaphoresis and fever.   Oree's medications list, allergies, past medical history and past surgical history were reviewed and excluded from this note due to being a worker's comp case.    Objective:  BP (!) 142/97 (BP Location: Right Arm, Patient Position: Sitting, Cuff Size: Normal)   Pulse 96   Temp 97.9 F (36.6 C) (Oral)   Physical Exam  Constitutional: He is oriented to person, place, and time and well-developed, well-nourished, and in no distress.  HENT:  Head: Normocephalic and atraumatic.  Eyes: Conjunctivae are normal.  Neck: Normal range of motion.  Pulmonary/Chest: Effort normal.  Musculoskeletal:       Left hand: He exhibits tenderness (wtih palpation over laceration). He exhibits normal range of motion (of 2nd digit), no bony tenderness and normal capillary refill. Normal sensation noted. Normal strength noted.  Neurological: He is alert and oriented to person, place, and time. Gait normal.  Skin: Skin is warm and dry.     Psychiatric: Affect normal.  Vitals reviewed.   Dg Finger Index Left  Result Date: 08/01/2017 CLINICAL DATA:  Smashed finger.  Laceration. EXAM: LEFT INDEX FINGER 2+V COMPARISON:  None. FINDINGS: The joint spaces are maintained. No acute fracture or radiopaque foreign body. Soft tissue swelling and laceration noted distally. IMPRESSION: No fracture or radiopaque foreign body Electronically Signed   By: Rudie Meyer M.D.   On:  08/01/2017 18:10     PROCEDURE NOTE: laceration repair Verbal consent obtained from patient.  Digital block with 4cc Lidocaine 2% without epinephrine was used.  Wound explored for tendon, ligament damage. Wound scrubbed with soap and water and rinsed. Wound closed with #8 5-0 Ethilon simple interuppted sutures.  Xeroform applied to exposed nail bed. Wound cleansed and dressed.   Assessment and Plan :   1. Laceration of index finger, foreign body presence unspecified, nail damage status unspecified, unspecified laterality, initial encounter Laceration repaired. Wound care instructions given. Return to clinic if symptoms worsen, do not improve, or as needed. - DG Finger Index Right; Future   Benjiman Core PA-C  Primary Care at [facility] 08/01/2017 6:57 PM

## 2017-08-28 IMAGING — DX DG FINGER INDEX 2+V*L*
3 series · 3 of 3 positions shown · non-contrast
Comparison: None.

CLINICAL DATA: Smashed finger.  Laceration.

EXAM:
LEFT INDEX FINGER 2+V

[finger ap]
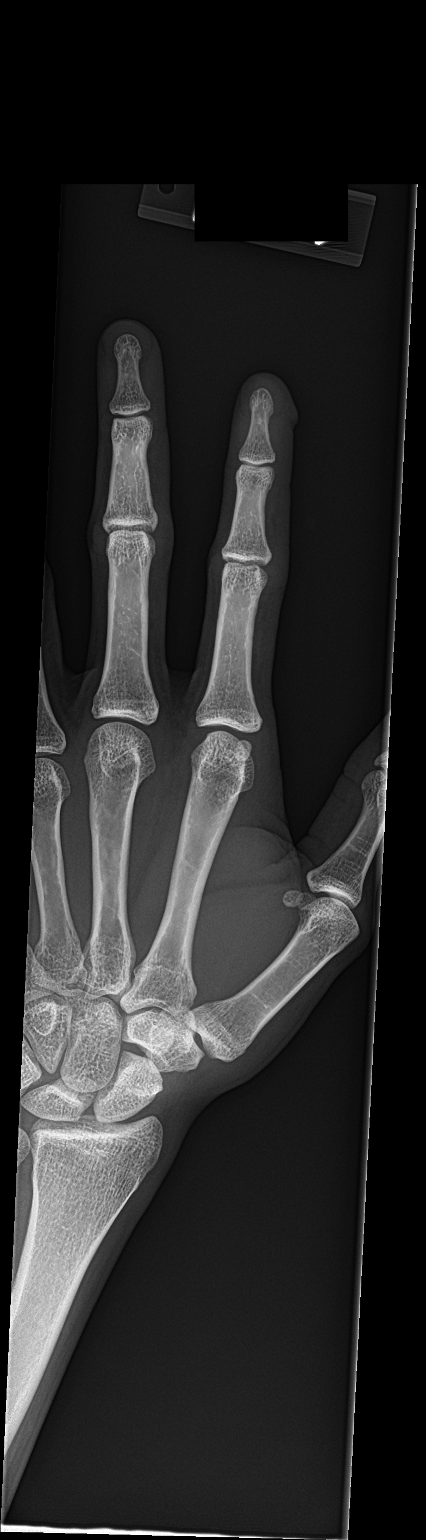

[finger obl]
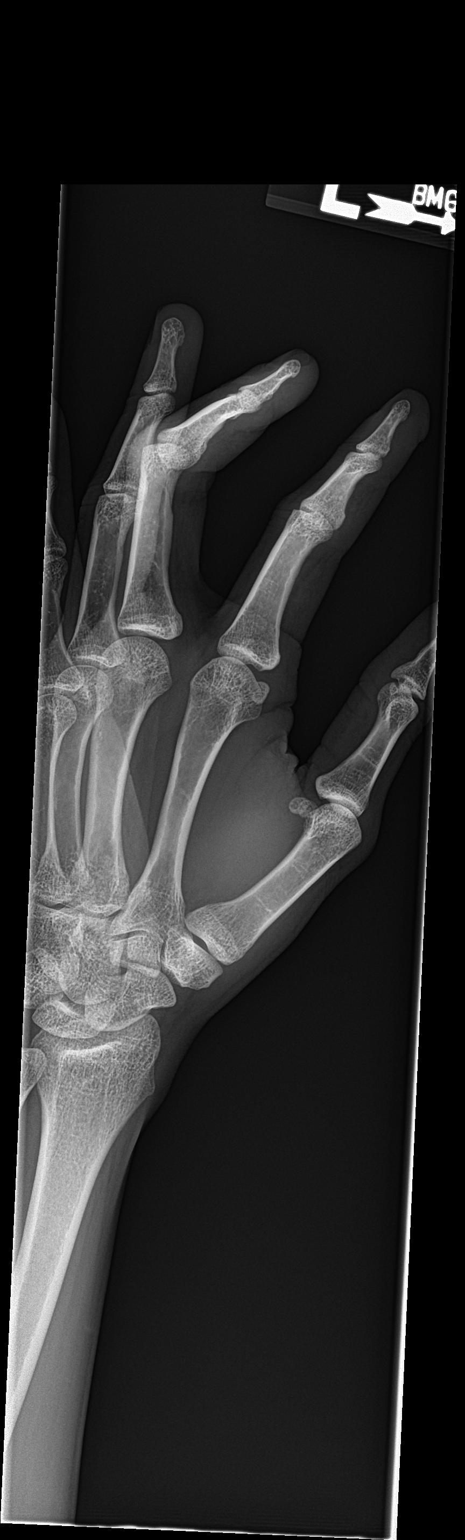

[finger lat]
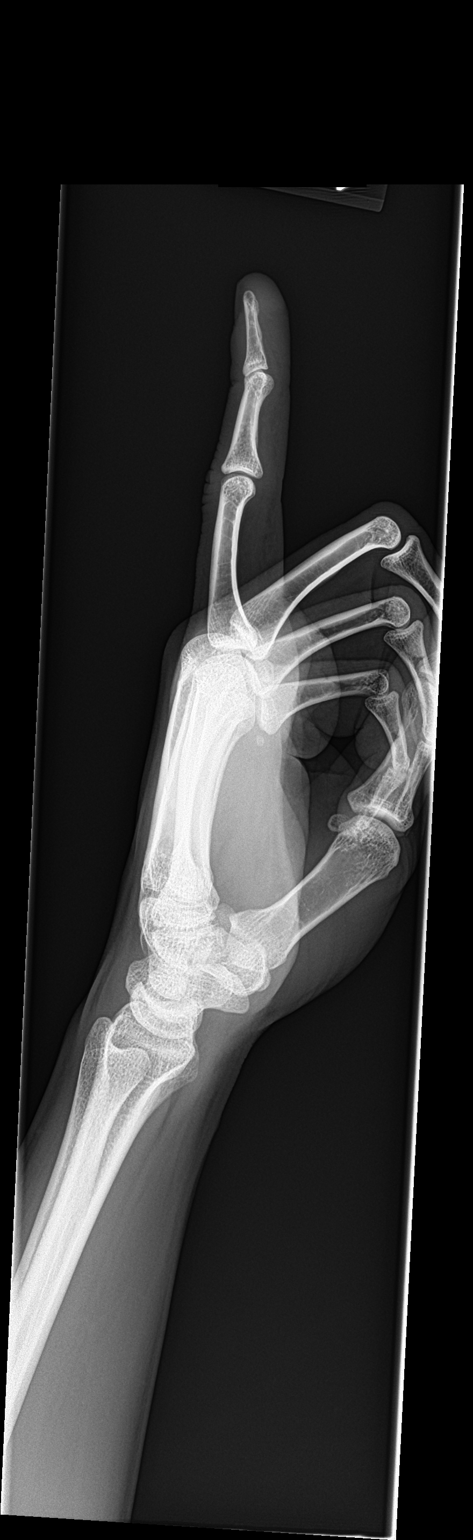

[3 of 3 positions shown; findings below may reference images not displayed]

FINDINGS: The joint spaces are maintained. No acute fracture or radiopaque
foreign body. Soft tissue swelling and laceration noted distally.
IMPRESSION: No fracture or radiopaque foreign body

## 2022-09-06 ENCOUNTER — Encounter (HOSPITAL_COMMUNITY): Payer: Self-pay

## 2022-09-06 ENCOUNTER — Ambulatory Visit (HOSPITAL_COMMUNITY)
Admission: EM | Admit: 2022-09-06 | Discharge: 2022-09-06 | Disposition: A | Discharge status: Home / Self Care | Payer: 59 | Attending: Family Medicine | Admitting: Family Medicine

## 2022-09-06 ENCOUNTER — Ambulatory Visit (INDEPENDENT_AMBULATORY_CARE_PROVIDER_SITE_OTHER): Payer: 59

## 2022-09-06 DIAGNOSIS — M25461 Effusion, right knee: Secondary | ICD-10-CM | POA: Diagnosis not present

## 2022-09-06 DIAGNOSIS — M25561 Pain in right knee: Secondary | ICD-10-CM

## 2022-09-06 DIAGNOSIS — J4521 Mild intermittent asthma with (acute) exacerbation: Secondary | ICD-10-CM | POA: Insufficient documentation

## 2022-09-06 LAB — BASIC METABOLIC PANEL
Anion gap: 12 (ref 5–15)
BUN: 12 mg/dL (ref 6–20)
CO2: 30 mmol/L (ref 22–32)
Calcium: 9.4 mg/dL (ref 8.9–10.3)
Chloride: 97 mmol/L — ABNORMAL LOW (ref 98–111)
Creatinine, Ser: 1.03 mg/dL (ref 0.61–1.24)
GFR, Estimated: >60 mL/min (ref >60)
Glucose, Bld: 93 mg/dL (ref 70–99)
Potassium: 4.4 mmol/L (ref 3.5–5.1)
Sodium: 139 mmol/L (ref 135–145)

## 2022-09-06 LAB — CBC
HCT: 44.6 % (ref 39.0–52.0)
Hemoglobin: 14.7 g/dL (ref 13.0–17.0)
MCH: 32.5 pg (ref 26.0–34.0)
MCHC: 33 g/dL (ref 30.0–36.0)
MCV: 98.7 fL (ref 80.0–100.0)
Platelets: 381 10*3/uL (ref 150–400)
RBC: 4.52 MIL/uL (ref 4.22–5.81)
RDW: 12.4 % (ref 11.5–15.5)
WBC: 10.2 10*3/uL (ref 4.0–10.5)
nRBC: 0 % (ref 0.0–0.2)

## 2022-09-06 LAB — URIC ACID: Uric Acid, Serum: 5.8 mg/dL (ref 3.7–8.6)

## 2022-09-06 MED ORDER — ALBUTEROL SULFATE HFA 108 (90 BASE) MCG/ACT IN AERS: 2.0000 | INHALATION_SPRAY | RESPIRATORY_TRACT | 0 refills | Status: DC | PRN

## 2022-09-06 MED ORDER — TRAMADOL HCL 50 MG PO TABS: 50.0000 mg | ORAL_TABLET | Freq: Four times a day (QID) | ORAL | 0 refills | Status: DC | PRN

## 2022-09-06 MED ORDER — KETOROLAC TROMETHAMINE 30 MG/ML IJ SOLN
30.0000 mg | Freq: Once | INTRAMUSCULAR | Status: AC
  Administered 2022-09-06: 30 mg via INTRAMUSCULAR

## 2022-09-06 MED ORDER — PREDNISONE 20 MG PO TABS: 40.0000 mg | ORAL_TABLET | Freq: Every day | ORAL | 0 refills | Status: AC

## 2022-09-06 MED ORDER — KETOROLAC TROMETHAMINE 30 MG/ML IJ SOLN
INTRAMUSCULAR | Status: AC
  Filled 2022-09-06: qty 1

## 2022-09-06 NOTE — ED Triage Notes (Signed)
Pt is here for fluid in the right knee causing pain and discomfort x2wks

## 2022-09-06 NOTE — ED Provider Notes (Signed)
[place]    CSN: [id number] Arrival date & time: 09/06/22  1603      History   Chief Complaint Chief Complaint  Patient presents with   Knee Pain    HPI Brady Rivera is a 36 y.o. male.    Knee Pain  Here for right knee pain and swelling that is been going on at least 2 weeks.  No prior trauma or fall.  No fever or chills and no rash  He has injured his left knee before but not his right.   He also has been having little more trouble with his asthma since the weather started to change.  No cough and no sore throat and no fever.  He does not have an inhaler at home  Past Medical History:  Diagnosis Date   Asthma     There are no problems to display for this patient.   History reviewed. No pertinent surgical history.     Home Medications    Prior to Admission medications   Medication Sig Start Date End Date Taking? Authorizing Provider  albuterol (VENTOLIN HFA) 108 (90 Base) MCG/ACT inhaler Inhale 2 puffs into the lungs every 4 (four) hours as needed for wheezing or shortness of breath. 09/06/22  Yes Gudrun Axe, Gwenlyn Perking, MD  predniSONE (DELTASONE) 20 MG tablet Take 2 tablets (40 mg total) by mouth daily with breakfast for 5 days. 09/06/22 09/11/22 Yes Dazhane Villagomez, Gwenlyn Perking, MD  traMADol (ULTRAM) 50 MG tablet Take 1 tablet (50 mg total) by mouth every 6 (six) hours as needed (pain). 09/06/22  Yes Barrett Henle, MD  cetirizine (ZYRTEC) 10 MG tablet Take 10 mg by mouth daily.    [provider]    Family History History reviewed. No pertinent family history.  Social History Social History   Tobacco Use   Smoking status: Never   Smokeless tobacco: Never  Substance Use Topics   Alcohol use: No   Drug use: No     Allergies   Patient has no known allergies.   Review of Systems Review of Systems   Physical Exam Triage Vital Signs ED Triage Vitals  Enc Vitals Group     BP 09/06/22 1708 138/89     Pulse Rate 09/06/22 1708 83      Resp 09/06/22 1708 12     Temp 09/06/22 1708 99.4 F (37.4 C)     Temp Source 09/06/22 1708 Oral     SpO2 09/06/22 1708 97 %     Weight 09/06/22 1707 163 lb (73.9 kg)     Height 09/06/22 1707 5\' 8"  (1.727 m)     Head Circumference --      Peak Flow --      Pain Score 09/06/22 1706 10     Pain Loc --      Pain Edu? --      Excl. in [place]? --    No data found.  Updated Vital Signs BP 138/89 (BP Location: Left Arm)   Pulse 83   Temp 99.4 F (37.4 C) (Oral)   Resp 12   Ht 5\' 8"  (1.727 m)   Wt 73.9 kg   SpO2 97%   BMI 24.78 kg/m   Visual Acuity Right Eye Distance:   Left Eye Distance:   Bilateral Distance:    Right Eye Near:   Left Eye Near:    Bilateral Near:     Physical Exam Vitals reviewed.  Constitutional:      General:  He is not in acute distress.    Appearance: He is not ill-appearing, toxic-appearing or diaphoretic.  Musculoskeletal:     Comments: There is obvious effusion of the right knee.  It is tender diffusely especially on the superior portion.  There is no warmth and no erythema  Neurological:     Mental Status: He is alert and oriented to person, place, and time.  Psychiatric:        Behavior: Behavior normal.      UC Treatments / Results  Labs (all labs ordered are listed, but only abnormal results are displayed) Labs Reviewed  CBC  BASIC METABOLIC PANEL  URIC ACID    EKG   Radiology DG Knee 2 Views Right  Result Date: 09/06/2022 CLINICAL DATA:  Right knee pain and effusion for 2 weeks EXAM: RIGHT KNEE - 1-2 VIEW COMPARISON:  None Available. FINDINGS: Joint effusion is present. There is no acute fracture or dislocation. Joint spaces are well maintained. No focal osseous lesion identified. IMPRESSION: Joint effusion. No acute fracture or dislocation. Electronically Signed   By: Ronney Asters M.D.   On: 09/06/2022 17:46    Procedures Procedures (including critical care time)  Medications Ordered in UC Medications  ketorolac  (TORADOL) 30 MG/ML injection 30 mg (has no administration in time range)    Initial Impression / Assessment and Plan / UC Course  I have reviewed the triage vital signs and the nursing notes.  Pertinent labs & imaging results that were available during my care of the patient were reviewed by me and considered in my medical decision making (see chart for details).        Xray shows the effusion, but no bony abnormality. Lab done today to check for gout. Rx for prednisone for 5 days, plus toradol today. Contact info for ortho provided, and assistance for finding a PCP is requested.  Tramadol for pain relief at home, ok on pmp.  Also albuterol rx provided Final Clinical Impressions(s) / UC Diagnoses   Final diagnoses:  Acute pain of right knee  Mild intermittent asthma with acute exacerbation     Discharge Instructions      Your x-ray did not show any fracture or any arthritis that was obvious  We have drawn blood work to check your uric acid and a blood count and a sugar.  Staff will notify you of any abnormalities that need treatment  Take prednisone 20 mg--2 daily for 5 days  Take tramadol 50 mg-- 1 tablet every 6 hours as needed for pain.  This medication can make you sleepy or dizzy  You have been given a shot of Toradol 30 mg today.      ED Prescriptions     Medication Sig Dispense Auth. Provider   albuterol (VENTOLIN HFA) 108 (90 Base) MCG/ACT inhaler Inhale 2 puffs into the lungs every 4 (four) hours as needed for wheezing or shortness of breath. 1 each Barrett Henle, MD   predniSONE (DELTASONE) 20 MG tablet Take 2 tablets (40 mg total) by mouth daily with breakfast for 5 days. 10 tablet Barrett Henle, MD   traMADol (ULTRAM) 50 MG tablet Take 1 tablet (50 mg total) by mouth every 6 (six) hours as needed (pain). 12 tablet Alma Muegge, Gwenlyn Perking, MD      I have reviewed the PDMP during this encounter.   Barrett Henle, MD 09/06/22 [phone]

## 2022-09-06 NOTE — Discharge Instructions (Addendum)
Your x-ray did not show any fracture or any arthritis that was obvious  We have drawn blood work to check your uric acid and a blood count and a sugar.  Staff will notify you of any abnormalities that need treatment  Take prednisone 20 mg--2 daily for 5 days  Take tramadol 50 mg-- 1 tablet every 6 hours as needed for pain.  This medication can make you sleepy or dizzy  You have been given a shot of Toradol 30 mg today.

## 2022-09-18 ENCOUNTER — Encounter (HOSPITAL_COMMUNITY): Payer: Self-pay | Admitting: *Deleted

## 2022-09-18 ENCOUNTER — Other Ambulatory Visit: Payer: Self-pay

## 2022-09-18 ENCOUNTER — Ambulatory Visit (HOSPITAL_COMMUNITY)
Admission: EM | Admit: 2022-09-18 | Discharge: 2022-09-18 | Disposition: A | Discharge status: Home / Self Care | Payer: 59 | Attending: Internal Medicine | Admitting: Internal Medicine

## 2022-09-18 DIAGNOSIS — M25461 Effusion, right knee: Secondary | ICD-10-CM | POA: Diagnosis not present

## 2022-09-18 MED ORDER — IBUPROFEN 600 MG PO TABS: 600.0000 mg | ORAL_TABLET | Freq: Three times a day (TID) | ORAL | 0 refills | Status: DC | PRN

## 2022-09-18 NOTE — ED Triage Notes (Signed)
PT continues to have RT knee pain. Pt last seen on 09-06-22.

## 2022-09-18 NOTE — Discharge Instructions (Signed)
Please wear your knee brace during the day. Rest  the affected painful area.   Apply cold compresses intermittently as needed.   As pain recedes, begin normal activities slowly as tolerated.   Use follow-up with sports medicine for further management

## 2022-09-18 NOTE — ED Provider Notes (Signed)
[facility]    CSN: [id number] Arrival date & time: 09/18/22  1454      History   Chief Complaint Chief Complaint  Patient presents with   Knee Pain    HPI Brady Rivera is a 36 y.o. male returns to the urgent care with recurrent right knee swelling.  Patient was seen recently in the urgent care setting him treated for right knee swelling.  X-ray was negative for osteoarthritis or fracture.  Patient denies any trauma to the right knee.  No fever or chills.  No right knee warmth.  Patient was prescribed a course of prednisone and tramadol.  That seems to have helped somewhat but the swelling returned.  Swelling is worse on the days that he works.  No known relieving factors.  No fever or chills.  No upper respiratory infection symptoms.  No rash.  No nausea, vomiting or diarrhea. HPI  Past Medical History:  Diagnosis Date   Asthma     There are no problems to display for this patient.   History reviewed. No pertinent surgical history.     Home Medications    Prior to Admission medications   Medication Sig Start Date End Date Taking? Authorizing Provider  ibuprofen (ADVIL) 600 MG tablet Take 1 tablet (600 mg total) by mouth every 8 (eight) hours as needed. 09/18/22  Yes Douglas Smolinsky, Britta Mccreedy, MD  albuterol (VENTOLIN HFA) 108 (90 Base) MCG/ACT inhaler Inhale 2 puffs into the lungs every 4 (four) hours as needed for wheezing or shortness of breath. 09/06/22   Zenia Resides, MD  cetirizine (ZYRTEC) 10 MG tablet Take 10 mg by mouth daily.    [provider]  traMADol (ULTRAM) 50 MG tablet Take 1 tablet (50 mg total) by mouth every 6 (six) hours as needed (pain). 09/06/22   Zenia Resides, MD    Family History History reviewed. No pertinent family history.  Social History Social History   Tobacco Use   Smoking status: Never   Smokeless tobacco: Never  Substance Use Topics   Alcohol use: No   Drug use: No     Allergies   Patient has no known  allergies.   Review of Systems Review of Systems As per HPI  Physical Exam Triage Vital Signs ED Triage Vitals  Enc Vitals Group     BP 09/18/22 1610 122/85     Pulse Rate 09/18/22 1610 87     Resp 09/18/22 1610 18     Temp 09/18/22 1610 98.7 F (37.1 C)     Temp src --      SpO2 09/18/22 1610 95 %     Weight --      Height --      Head Circumference --      Peak Flow --      Pain Score 09/18/22 1608 10     Pain Loc --      Pain Edu? --      Excl. in GC? --    No data found.  Updated Vital Signs BP 122/85   Pulse 87   Temp 98.7 F (37.1 C)   Resp 18   SpO2 95%   Visual Acuity Right Eye Distance:   Left Eye Distance:   Bilateral Distance:    Right Eye Near:   Left Eye Near:    Bilateral Near:     Physical Exam Vitals and nursing note reviewed.  Constitutional:      General: He is  not in acute distress.    Appearance: He is not ill-appearing.  Cardiovascular:     Rate and Rhythm: Normal rate and regular rhythm.  Pulmonary:     Effort: Pulmonary effort is normal.     Breath sounds: Normal breath sounds.  Abdominal:     General: Bowel sounds are normal.     Palpations: Abdomen is soft.  Musculoskeletal:        General: Swelling present. No tenderness or deformity.  Skin:    General: Skin is warm.     Findings: No bruising, erythema or lesion.  Neurological:     Mental Status: He is alert.      UC Treatments / Results  Labs (all labs ordered are listed, but only abnormal results are displayed) Labs Reviewed - No data to display  EKG   Radiology No results found.  Procedures Procedures (including critical care time)  Medications Ordered in UC Medications - No data to display  Initial Impression / Assessment and Plan / UC Course  I have reviewed the triage vital signs and the nursing notes.  Pertinent labs & imaging results that were available during my care of the patient were reviewed by me and considered in my medical decision  making (see chart for details).     1.  Right knee effusion: Recent x-ray of the right knee is negative for fracture or arthritis Right knee sleeve Orthopedic surgery follow-up for further management/advanced imaging Icing of the right knee Gentle range of motion exercises Return precautions given. Final Clinical Impressions(s) / UC Diagnoses   Final diagnoses:  Effusion of right knee     Discharge Instructions      Please wear your knee brace during the day. Rest  the affected painful area.   Apply cold compresses intermittently as needed.   As pain recedes, begin normal activities slowly as tolerated.   Use follow-up with sports medicine for further management   ED Prescriptions     Medication Sig Dispense Auth. Provider   ibuprofen (ADVIL) 600 MG tablet Take 1 tablet (600 mg total) by mouth every 8 (eight) hours as needed. 30 tablet Adaijah Endres, Myrene Galas, MD      PDMP not reviewed this encounter.   Chase Picket, MD 09/18/22 [phone]

## 2022-09-25 ENCOUNTER — Emergency Department (HOSPITAL_COMMUNITY)
Admission: EM | Admit: 2022-09-25 | Discharge: 2022-09-26 | Discharge status: Against Medical Advice | Payer: 59 | Attending: Emergency Medicine | Admitting: Emergency Medicine

## 2022-09-25 ENCOUNTER — Encounter (HOSPITAL_COMMUNITY): Payer: Self-pay | Admitting: *Deleted

## 2022-09-25 ENCOUNTER — Other Ambulatory Visit: Payer: Self-pay

## 2022-09-25 ENCOUNTER — Emergency Department (HOSPITAL_COMMUNITY): Payer: 59

## 2022-09-25 DIAGNOSIS — Z5321 Procedure and treatment not carried out due to patient leaving prior to being seen by health care provider: Secondary | ICD-10-CM | POA: Diagnosis not present

## 2022-09-25 DIAGNOSIS — M25561 Pain in right knee: Secondary | ICD-10-CM | POA: Insufficient documentation

## 2022-09-25 MED ORDER — OXYCODONE-ACETAMINOPHEN 5-325 MG PO TABS
1.0000 | ORAL_TABLET | ORAL | Status: DC | PRN
  Administered 2022-09-25: 1 via ORAL
  Filled 2022-09-25: qty 1

## 2022-09-25 NOTE — ED Triage Notes (Signed)
Pt states he was seen at [facility] a couple of weeks ago for right knee pain. He was referred to the ortho doc and cannot afford to go to them. Pt has swelling to the knee. Hard to ambulate

## 2022-09-25 NOTE — ED Provider Triage Note (Signed)
Emergency Medicine Provider Triage Evaluation Note  Brady Rivera , a 36 y.o. male  was evaluated in triage.  Pt complains of right knee pain.  Patient states that has been swollen for 3 weeks now.  He was originally seen by urgent care and instructed to follow-up with orthopedics.  States that he does not have the ability to pay for outpatient orthopedic care.  He states that he has had severe pain that has been worsening in the right knee.  He denies any trauma.  Denies history of gout or arthritis.  He states that there is been warmth to the knee without erythema or fevers.  Denies previous surgeries to the joint..  Review of Systems  Positive: See above Negative:   Physical Exam  BP (!) 125/101 (BP Location: Right Arm)   Pulse 88   Temp 98.6 F (37 C)   Resp 18   SpO2 99%  Gen:   Awake, no distress   Resp:  Normal effort  MSK:   Limited range of motion to the right knee.  Large effusion.  No erythema. Other:    Medical Decision Making  Medically screening exam initiated at 5:12 PM.  Appropriate orders placed.  Kenyon Ana was informed that the remainder of the evaluation will be completed by another provider, this initial triage assessment does not replace that evaluation, and the importance of remaining in the ED until their evaluation is complete.     Mickie Hillier, PA-C 09/25/22 1714

## 2022-09-26 NOTE — ED Notes (Signed)
Patient cussing and yelling about wait time. States "you can take me off the fucking list I'm ready to go.'

## 2022-09-27 DIAGNOSIS — M25561 Pain in right knee: Secondary | ICD-10-CM | POA: Diagnosis not present

## 2022-09-27 DIAGNOSIS — M25461 Effusion, right knee: Secondary | ICD-10-CM | POA: Diagnosis not present

## 2022-09-29 ENCOUNTER — Other Ambulatory Visit: Payer: Self-pay

## 2022-09-29 ENCOUNTER — Encounter (HOSPITAL_COMMUNITY): Payer: Self-pay | Admitting: Orthopedic Surgery

## 2022-09-29 NOTE — Anesthesia Preprocedure Evaluation (Signed)
Anesthesia Evaluation  Patient identified by MRN, date of birth, ID band Patient awake    Reviewed: Allergy & Precautions, NPO status , Patient's Chart, lab work & pertinent test results  Airway Mallampati: II  TM Distance: >3 FB Neck ROM: Full    Dental  (+) Chipped, Dental Advisory Given,    Pulmonary asthma    Pulmonary exam normal breath sounds clear to auscultation       Cardiovascular negative cardio ROS Normal cardiovascular exam Rhythm:Regular Rate:Normal     Neuro/Psych negative neurological ROS     GI/Hepatic negative GI ROS, Neg liver ROS,,,  Endo/Other  negative endocrine ROS    Renal/GU negative Renal ROS     Musculoskeletal negative musculoskeletal ROS (+)    Abdominal   Peds  Hematology negative hematology ROS (+)   Anesthesia Other Findings   Reproductive/Obstetrics                             Anesthesia Physical Anesthesia Plan  ASA: 2  Anesthesia Plan: General   Post-op Pain Management: Tylenol PO (pre-op)* and Toradol IV (intra-op)*   Induction: Intravenous  PONV Risk Score and Plan: 2 and Ondansetron, Dexamethasone, Treatment may vary due to age or medical condition and Midazolam  Airway Management Planned: Oral ETT  Additional Equipment:   Intra-op Plan:   Post-operative Plan: Extubation in OR  Informed Consent: I have reviewed the patients History and Physical, chart, labs and discussed the procedure including the risks, benefits and alternatives for the proposed anesthesia with the patient or authorized representative who has indicated his/her understanding and acceptance.     Dental advisory given  Plan Discussed with: CRNA  Anesthesia Plan Comments:        Anesthesia Quick Evaluation

## 2022-09-29 NOTE — Progress Notes (Signed)
Mr. Brady Rivera denies chest pain or shortness of breath.  Patient denies having any s/s of Covid in his household, also denies any known exposure to Covid.  Brady Rivera does not have a PCP.  I noted high blood pressures when he was in the ED.  I asked if anyone has every told him that he has high blood pressure, he said no. I encouraged patient o drink lots of water today and in am.

## 2022-09-29 NOTE — H&P (Signed)
PREOPERATIVE H&P  Chief Complaint: RIGHT KNEE INFECTION  HPI: Brady Rivera is a 36 y.o. male who presents with a diagnosis of RIGHT KNEE INFECTION. Symptoms are rated as moderate to severe, and have been worsening.  This is significantly impairing activities of daily living.  He has elected for surgical management.   Past Medical History:  Diagnosis Date   Asthma    No past surgical history on file. Social History   Socioeconomic History   Marital status: Single    Spouse name: Not on file   Number of children: Not on file   Years of education: Not on file   Highest education level: Not on file  Occupational History   Not on file  Tobacco Use   Smoking status: Never    Passive exposure: Never   Smokeless tobacco: Never  Substance and Sexual Activity   Alcohol use: Yes    Comment: socially   Drug use: No   Sexual activity: Not on file  Other Topics Concern   Not on file  Social History Narrative   Not on file   Social Determinants of Health   Financial Resource Strain: Not on file  Food Insecurity: Not on file  Transportation Needs: Not on file  Physical Activity: Not on file  Stress: Not on file  Social Connections: Not on file   No family history on file. No Known Allergies Prior to Admission medications   Medication Sig Start Date End Date Taking? Authorizing Provider  albuterol (VENTOLIN HFA) 108 (90 Base) MCG/ACT inhaler Inhale 2 puffs into the lungs every 4 (four) hours as needed for wheezing or shortness of breath. 09/06/22  Yes Barrett Henle, MD  DESCOVY 200-25 MG tablet Take 1 tablet by mouth daily. 09/21/22  Yes [provider]  diphenhydrAMINE (BENADRYL) 25 MG tablet Take 50 mg by mouth every 6 (six) hours as needed for allergies.   Yes [provider]  fluticasone (FLONASE) 50 MCG/ACT nasal spray Place 2 sprays into both nostrils daily as needed for allergies or rhinitis.   Yes [provider]  ibuprofen (ADVIL) 600 MG  tablet Take 1 tablet (600 mg total) by mouth every 8 (eight) hours as needed. 09/18/22  Yes Lamptey, Myrene Galas, MD  Menthol, Topical Analgesic, (ICY HOT EX) Apply 1 Application topically daily as needed.   Yes [provider]  Menthol-Methyl Salicylate (MUSCLE RUB) 10-15 % CREA Apply 1 Application topically as needed for muscle pain.   Yes [provider]  traMADol (ULTRAM) 50 MG tablet Take 1 tablet (50 mg total) by mouth every 6 (six) hours as needed (pain). Patient not taking: Reported on 09/29/2022 09/06/22   Barrett Henle, MD     Positive ROS: All other systems have been reviewed and were otherwise negative with the exception of those mentioned in the HPI and as above.  Physical Exam: General: Alert, no acute distress Cardiovascular: No pedal edema Respiratory: No cyanosis, no use of accessory musculature GI: No organomegaly, abdomen is soft and non-tender Skin: No lesions in the area of chief complaint Neurologic: Sensation intact distally Psychiatric: Patient is competent for consent with normal mood and affect Lymphatic: No axillary or cervical lymphadenopathy  MUSCULOSKELETAL: TTP right knee, severe effusion present, limited ROM, ambulates with a cane, decreased strength RLE, NVI    ASPIRATION RESULTS:   Specimen Source: R knee   Specimen Quality: Adequate   Gram Stain: Many White blood cells seen   No epithelial cells seen  No organisms seen    CULTURE, AEROBIC BACTERIA SEE NOTE    P   CULTURE, AEROBIC BACTERIA    Result: No growth to date           CELL COUNT AND DIFF, SYNOVIAL FLUID   COLOR                                             DARK YELLOW   APPEARANCE                                  CLOUDY   TOTAL NUCLEATED CELL CT         51690 H cells/uL  NEUTROPHILS, %                             80 H %   LYMPHOCYTES, %                            18 N %   MONOCYTE/MACROPHAGE, %         2 N %   EOSINOPHILS, %                                0 N %    BASOPHILS, %                                    0 N %   SYNOVIOCYTES, %                            0 N %     CRYSTALS, SYNOVIAL FLUID   None seen        Imaging: n/a   Assessment: RIGHT KNEE INFECTION  Plan: Plan for Procedure(s): RIGHT KNEE ARTHROSCOPIC IRRIGATION AND DEBRIDEMENT  The risks benefits and alternatives were discussed with the patient including but not limited to the risks of nonoperative treatment, versus surgical intervention including infection, bleeding, nerve injury,  blood clots, cardiopulmonary complications, morbidity, mortality, among others, and they were willing to proceed.   Weightbearing: WBAT RLE Orthopedic devices: none Showering: POD 3 Dressing: reinforce PRN Medicines: ASA, Norco, Celebrex  Discharge: home Follow up: 1 week post op  Will possibly also need outpatient referral to Infectious Disease.    Britt Bottom, PA-C Office [phone] 09/29/2022 1:50 PM

## 2022-09-30 ENCOUNTER — Encounter (HOSPITAL_COMMUNITY)
Admission: RE | Disposition: A | Discharge status: Home / Self Care | Payer: Self-pay | Source: Ambulatory Visit | Attending: Orthopedic Surgery

## 2022-09-30 ENCOUNTER — Ambulatory Visit (HOSPITAL_COMMUNITY)
Admission: RE | Admit: 2022-09-30 | Discharge: 2022-09-30 | Disposition: A | Discharge status: Home / Self Care | Payer: 59 | Source: Ambulatory Visit | Attending: Orthopedic Surgery | Admitting: Orthopedic Surgery

## 2022-09-30 ENCOUNTER — Ambulatory Visit (HOSPITAL_BASED_OUTPATIENT_CLINIC_OR_DEPARTMENT_OTHER): Payer: 59 | Admitting: Anesthesiology

## 2022-09-30 ENCOUNTER — Other Ambulatory Visit: Payer: Self-pay

## 2022-09-30 ENCOUNTER — Ambulatory Visit (HOSPITAL_COMMUNITY): Payer: 59 | Admitting: Anesthesiology

## 2022-09-30 ENCOUNTER — Encounter (HOSPITAL_COMMUNITY): Payer: Self-pay | Admitting: Orthopedic Surgery

## 2022-09-30 DIAGNOSIS — M009 Pyogenic arthritis, unspecified: Secondary | ICD-10-CM | POA: Diagnosis not present

## 2022-09-30 DIAGNOSIS — J45909 Unspecified asthma, uncomplicated: Secondary | ICD-10-CM | POA: Diagnosis not present

## 2022-09-30 HISTORY — DX: Allergy, unspecified, initial encounter: T78.40XA

## 2022-09-30 HISTORY — PX: KNEE ARTHROSCOPY: SHX127

## 2022-09-30 HISTORY — DX: COVID-19: U07.1

## 2022-09-30 SURGERY — ARTHROSCOPY, KNEE
Anesthesia: General | Site: Knee | Laterality: Right

## 2022-09-30 MED ORDER — AMISULPRIDE (ANTIEMETIC) 5 MG/2ML IV SOLN: 10.0000 mg | Freq: Once | INTRAVENOUS | Status: DC | PRN

## 2022-09-30 MED ORDER — PROMETHAZINE HCL 25 MG/ML IJ SOLN: 6.2500 mg | INTRAMUSCULAR | Status: DC | PRN

## 2022-09-30 MED ORDER — FENTANYL CITRATE (PF) 250 MCG/5ML IJ SOLN
INTRAMUSCULAR | Status: AC
  Filled 2022-09-30: qty 5

## 2022-09-30 MED ORDER — POVIDONE-IODINE 10 % EX SWAB
2.0000 | Freq: Once | CUTANEOUS | Status: AC
  Administered 2022-09-30: 2 via TOPICAL

## 2022-09-30 MED ORDER — PROPOFOL 10 MG/ML IV BOLUS
INTRAVENOUS | Status: AC
  Filled 2022-09-30: qty 20

## 2022-09-30 MED ORDER — SULFAMETHOXAZOLE-TRIMETHOPRIM 800-160 MG PO TABS: 1.0000 | ORAL_TABLET | Freq: Two times a day (BID) | ORAL | 0 refills | Status: AC

## 2022-09-30 MED ORDER — PHENYLEPHRINE 80 MCG/ML (10ML) SYRINGE FOR IV PUSH (FOR BLOOD PRESSURE SUPPORT)
PREFILLED_SYRINGE | INTRAVENOUS | Status: DC | PRN
  Administered 2022-09-30: 80 ug via INTRAVENOUS

## 2022-09-30 MED ORDER — DEXAMETHASONE SODIUM PHOSPHATE 10 MG/ML IJ SOLN
INTRAMUSCULAR | Status: AC
  Filled 2022-09-30: qty 1

## 2022-09-30 MED ORDER — CEFAZOLIN SODIUM-DEXTROSE 2-4 GM/100ML-% IV SOLN
INTRAVENOUS | Status: AC
  Filled 2022-09-30: qty 100

## 2022-09-30 MED ORDER — OXYCODONE HCL 5 MG/5ML PO SOLN
ORAL | Status: AC
  Filled 2022-09-30: qty 5

## 2022-09-30 MED ORDER — BUPIVACAINE HCL (PF) 0.25 % IJ SOLN
INTRAMUSCULAR | Status: AC
  Filled 2022-09-30: qty 30

## 2022-09-30 MED ORDER — MIDAZOLAM HCL 5 MG/5ML IJ SOLN
INTRAMUSCULAR | Status: DC | PRN
  Administered 2022-09-30: 2 mg via INTRAVENOUS

## 2022-09-30 MED ORDER — CHLORHEXIDINE GLUCONATE 0.12 % MT SOLN: 15.0000 mL | Freq: Once | OROMUCOSAL | Status: AC

## 2022-09-30 MED ORDER — MIDAZOLAM HCL 2 MG/2ML IJ SOLN
INTRAMUSCULAR | Status: AC
  Filled 2022-09-30: qty 2

## 2022-09-30 MED ORDER — ACETAMINOPHEN 500 MG PO TABS: 1000.0000 mg | ORAL_TABLET | Freq: Once | ORAL | Status: AC

## 2022-09-30 MED ORDER — HYDROCODONE-ACETAMINOPHEN 5-325 MG PO TABS: 1.0000 | ORAL_TABLET | Freq: Four times a day (QID) | ORAL | 0 refills | Status: DC | PRN

## 2022-09-30 MED ORDER — ORAL CARE MOUTH RINSE: 15.0000 mL | Freq: Once | OROMUCOSAL | Status: AC

## 2022-09-30 MED ORDER — DEXAMETHASONE SODIUM PHOSPHATE 10 MG/ML IJ SOLN: 8.0000 mg | Freq: Once | INTRAMUSCULAR | Status: DC

## 2022-09-30 MED ORDER — SUCCINYLCHOLINE CHLORIDE 200 MG/10ML IV SOSY
PREFILLED_SYRINGE | INTRAVENOUS | Status: DC | PRN
  Administered 2022-09-30: 80 mg via INTRAVENOUS

## 2022-09-30 MED ORDER — GENTAMICIN SULFATE 40 MG/ML IJ SOLN
INTRAMUSCULAR | Status: DC | PRN
  Administered 2022-09-30: 5 mL

## 2022-09-30 MED ORDER — ONDANSETRON HCL 4 MG PO TABS: 4.0000 mg | ORAL_TABLET | Freq: Three times a day (TID) | ORAL | 0 refills | Status: AC | PRN

## 2022-09-30 MED ORDER — FENTANYL CITRATE (PF) 250 MCG/5ML IJ SOLN
INTRAMUSCULAR | Status: DC | PRN
  Administered 2022-09-30 (×2): 50 ug via INTRAVENOUS
  Administered 2022-09-30: 25 ug via INTRAVENOUS
  Administered 2022-09-30: 50 ug via INTRAVENOUS

## 2022-09-30 MED ORDER — MEPERIDINE HCL 25 MG/ML IJ SOLN: 6.2500 mg | INTRAMUSCULAR | Status: DC | PRN

## 2022-09-30 MED ORDER — KETOROLAC TROMETHAMINE 30 MG/ML IJ SOLN
INTRAMUSCULAR | Status: DC | PRN
  Administered 2022-09-30: 30 mg via INTRAVENOUS

## 2022-09-30 MED ORDER — OXYCODONE HCL 5 MG/5ML PO SOLN
5.0000 mg | Freq: Once | ORAL | Status: AC | PRN
  Administered 2022-09-30: 5 mg via ORAL

## 2022-09-30 MED ORDER — LIDOCAINE 2% (20 MG/ML) 5 ML SYRINGE
INTRAMUSCULAR | Status: DC | PRN
  Administered 2022-09-30: 70 mg via INTRAVENOUS

## 2022-09-30 MED ORDER — ASPIRIN 81 MG PO CHEW: 81.0000 mg | CHEWABLE_TABLET | Freq: Two times a day (BID) | ORAL | 0 refills | Status: AC

## 2022-09-30 MED ORDER — KETOROLAC TROMETHAMINE 30 MG/ML IJ SOLN
INTRAMUSCULAR | Status: AC
  Filled 2022-09-30: qty 1

## 2022-09-30 MED ORDER — HYDROMORPHONE HCL 1 MG/ML IJ SOLN: 0.2500 mg | INTRAMUSCULAR | Status: DC | PRN

## 2022-09-30 MED ORDER — SODIUM CHLORIDE 0.9 % IR SOLN
Status: DC | PRN
  Administered 2022-09-30: 9000 mL

## 2022-09-30 MED ORDER — ACETAMINOPHEN 500 MG PO TABS
ORAL_TABLET | ORAL | Status: AC
  Administered 2022-09-30: 1000 mg via ORAL
  Filled 2022-09-30: qty 2

## 2022-09-30 MED ORDER — LACTATED RINGERS IV SOLN: INTRAVENOUS | Status: DC

## 2022-09-30 MED ORDER — CHLORHEXIDINE GLUCONATE 0.12 % MT SOLN
OROMUCOSAL | Status: AC
  Administered 2022-09-30: 15 mL
  Filled 2022-09-30: qty 15

## 2022-09-30 MED ORDER — CEFAZOLIN SODIUM-DEXTROSE 2-4 GM/100ML-% IV SOLN
2.0000 g | INTRAVENOUS | Status: AC
  Administered 2022-09-30: 2 g via INTRAVENOUS

## 2022-09-30 MED ORDER — GENTAMICIN SULFATE 40 MG/ML IJ SOLN
INTRAMUSCULAR | Status: AC
  Filled 2022-09-30: qty 2

## 2022-09-30 MED ORDER — OXYCODONE HCL 5 MG PO TABS: 5.0000 mg | ORAL_TABLET | Freq: Once | ORAL | Status: AC | PRN

## 2022-09-30 MED ORDER — PROPOFOL 10 MG/ML IV BOLUS
INTRAVENOUS | Status: DC | PRN
  Administered 2022-09-30: 40 mg via INTRAVENOUS
  Administered 2022-09-30 (×2): 20 mg via INTRAVENOUS
  Administered 2022-09-30: 160 mg via INTRAVENOUS

## 2022-09-30 MED ORDER — DEXAMETHASONE SODIUM PHOSPHATE 10 MG/ML IJ SOLN
INTRAMUSCULAR | Status: DC | PRN
  Administered 2022-09-30: 4 mg via INTRAVENOUS

## 2022-09-30 MED ORDER — ONDANSETRON HCL 4 MG/2ML IJ SOLN
INTRAMUSCULAR | Status: DC | PRN
  Administered 2022-09-30: 4 mg via INTRAVENOUS

## 2022-09-30 SURGICAL SUPPLY — 47 items
BAG COUNTER SPONGE SURGICOUNT (BAG) ×1 IMPLANT
BAG SPNG CNTER NS LX DISP (BAG) ×1
BANDAGE ESMARK 6X9 LF (GAUZE/BANDAGES/DRESSINGS) IMPLANT
BLADE CLIPPER SURG (BLADE) IMPLANT
BLADE EXCALIBUR 4.0X13 (MISCELLANEOUS) ×1 IMPLANT
BNDG CMPR 9X6 STRL LF SNTH (GAUZE/BANDAGES/DRESSINGS) ×1
BNDG CMPR MED 10X6 ELC LF (GAUZE/BANDAGES/DRESSINGS) ×1
BNDG ELASTIC 6X10 VLCR STRL LF (GAUZE/BANDAGES/DRESSINGS) IMPLANT
BNDG ELASTIC 6X5.8 VLCR STR LF (GAUZE/BANDAGES/DRESSINGS) IMPLANT
BNDG ESMARK 6X9 LF (GAUZE/BANDAGES/DRESSINGS) ×1
COVER SURGICAL LIGHT HANDLE (MISCELLANEOUS) ×1 IMPLANT
CUFF TOURN SGL QUICK 34 (TOURNIQUET CUFF)
CUFF TRNQT CYL 34X4.125X (TOURNIQUET CUFF) IMPLANT
DRAPE ARTHROSCOPY W/POUCH 114 (DRAPES) ×1 IMPLANT
DRAPE HALF SHEET 40X57 (DRAPES) ×1 IMPLANT
DRAPE U-SHAPE 47X51 STRL (DRAPES) ×1 IMPLANT
DRSG XEROFORM 1X8 (GAUZE/BANDAGES/DRESSINGS) IMPLANT
DURAPREP 26ML APPLICATOR (WOUND CARE) ×1 IMPLANT
FACESHIELD WRAPAROUND (MASK) ×1 IMPLANT
FACESHIELD WRAPAROUND OR TEAM (MASK) ×1 IMPLANT
GAUZE PAD ABD 8X10 STRL (GAUZE/BANDAGES/DRESSINGS) IMPLANT
GAUZE SPONGE 4X4 12PLY STRL (GAUZE/BANDAGES/DRESSINGS) IMPLANT
GAUZE XEROFORM 1X8 LF (GAUZE/BANDAGES/DRESSINGS) ×1 IMPLANT
GLOVE BIO SURGEON STRL SZ7.5 (GLOVE) ×1 IMPLANT
GLOVE BIOGEL PI IND STRL 7.5 (GLOVE) ×1 IMPLANT
GLOVE BIOGEL PI IND STRL 8 (GLOVE) ×1 IMPLANT
GLOVE SURG SYN 7.5  E (GLOVE) ×1
GLOVE SURG SYN 7.5 E (GLOVE) ×1 IMPLANT
GLOVE SURG SYN 7.5 PF PI (GLOVE) ×1 IMPLANT
GOWN STRL REUS W/ TWL LRG LVL3 (GOWN DISPOSABLE) ×2 IMPLANT
GOWN STRL REUS W/ TWL XL LVL3 (GOWN DISPOSABLE) ×2 IMPLANT
GOWN STRL REUS W/TWL LRG LVL3 (GOWN DISPOSABLE) ×3
GOWN STRL REUS W/TWL XL LVL3 (GOWN DISPOSABLE)
KIT TURNOVER KIT B (KITS) ×1 IMPLANT
MANIFOLD NEPTUNE II (INSTRUMENTS) IMPLANT
NS IRRIG 1000ML POUR BTL (IV SOLUTION) IMPLANT
PACK ARTHROSCOPY DSU (CUSTOM PROCEDURE TRAY) ×1 IMPLANT
PAD ARMBOARD 7.5X6 YLW CONV (MISCELLANEOUS) ×2 IMPLANT
PADDING CAST COTTON 6X4 STRL (CAST SUPPLIES) IMPLANT
SPONGE T-LAP 4X18 ~~LOC~~+RFID (SPONGE) ×1 IMPLANT
SUT ETHILON 2 0 FS 18 (SUTURE) IMPLANT
SUT ETHILON 3 0 PS 1 (SUTURE) IMPLANT
TOWEL GREEN STERILE (TOWEL DISPOSABLE) ×1 IMPLANT
TOWEL GREEN STERILE FF (TOWEL DISPOSABLE) ×1 IMPLANT
TUBE CONNECTING 12X1/4 (SUCTIONS) ×1 IMPLANT
TUBING ARTHROSCOPY IRRIG 16FT (MISCELLANEOUS) ×1 IMPLANT
WATER STERILE IRR 1000ML POUR (IV SOLUTION) ×1 IMPLANT

## 2022-09-30 NOTE — Transfer of Care (Signed)
Immediate Anesthesia Transfer of Care Note  Patient: Brady Rivera  Procedure(s) Performed: ARTHROSCOPIC IRRIGATION AND DEBRIDEMENT RIGHT KNEE (Right: Knee)  Patient Location: PACU  Anesthesia Type:General  Level of Consciousness: drowsy  Airway & Oxygen Therapy: Patient Spontanous Breathing and Patient connected to nasal cannula oxygen  Post-op Assessment: Report given to RN and Post -op Vital signs reviewed and stable  Post vital signs: Reviewed and stable  Last Vitals:  Vitals Value Taken Time  BP 130/88 09/30/22 0849  Temp    Pulse    Resp 19 09/30/22 0851  SpO2    Vitals shown include unvalidated device data.  Last Pain:  Vitals:   09/30/22 0625  TempSrc: Oral  PainSc: 0-No pain         Complications: No notable events documented.

## 2022-09-30 NOTE — Op Note (Signed)
09/30/2022  1:02 PM  PATIENT:  Brady Rivera    PRE-OPERATIVE DIAGNOSIS:  RIGHT KNEE INFECTION  POST-OPERATIVE DIAGNOSIS:  Same  PROCEDURE:  ARTHROSCOPIC IRRIGATION AND DEBRIDEMENT RIGHT KNEE  SURGEON:  Renette Butters, MD  ASSISTANT: Aggie Moats, PA-C, he was present and scrubbed throughout the case, critical for completion in a timely fashion, and for retraction, instrumentation, and closure.   ANESTHESIA:   General  BLOOD LOSS: min  COMPLICATIONS: None   PREOPERATIVE INDICATIONS:  Brady Rivera is a  36 y.o. male with a diagnosis of RIGHT KNEE INFECTION who failed conservative measures and elected for surgical management.    The risks benefits and alternatives were discussed with the patient preoperatively including but not limited to the risks of infection, bleeding, nerve injury, cardiopulmonary complications, the need for revision surgery, among others, and the patient was willing to proceed.  OPERATIVE IMPLANTS: none  OPERATIVE FINDINGS: Examination under anesthesia: stable Diagnostic Arthroscopy:  articular cartilage:stable Medial meniscus:nml  Lateral meniscus:nml Anterior cruciate ligament/PCL: intact Loose bodies: none    OPERATIVE PROCEDURE:  Patient was identified in the preoperative holding area and site was marked by me male was transported to the operating theater and placed on the table in supine position taking care to pad all bony prominences. After a preincinduction time out anesthesia was induced.  male received ancef for preoperative antibiotics. The right lower extremity was prepped and draped in normal sterile fashion and a pre-incision timeout was performed.   A small stab incision was made in the anterolateral portal position. The arthroscope was introduced in the joint. A medial portal was then established under direct visualization just above the anterior horn of the medial meniscus. Diagnostic arthroscopy was then carried out with  findings as described above.  Arthroscope was inserted thorough flush of the knee joint performed I performed a synovectomy using a shaver I ran 9 L of saline through the knee I exposed the gutters each joint space and superior pouch performing a synovectomy at this time.  Cartilage meniscal exams were normal  The arthroscopic equipment was removed from the joint and the portals were closed with 3-0 nylon in an interrupted fashion. The knee was infiltrated with gent and Marcaine.  Sterile dressings were then applied including Xeroform 4 x 4's ABDs an ACE bandage.  The patient was then allowed to awaken from general anesthesia, transferred to the stretcher and taken to the recovery room in stable condition.  POSTOPERATIVE PLAN: The patient will be discharged home today and will followup in one week for suture removal and wound check.  VTE prophylaxis: Mobilize and chemical DVT prophylaxis

## 2022-09-30 NOTE — Anesthesia Postprocedure Evaluation (Signed)
Anesthesia Post Note  Patient: Brady Rivera  Procedure(s) Performed: ARTHROSCOPIC IRRIGATION AND DEBRIDEMENT RIGHT KNEE (Right: Knee)     Patient location during evaluation: PACU Anesthesia Type: General Level of consciousness: sedated and patient cooperative Pain management: pain level controlled Vital Signs Assessment: post-procedure vital signs reviewed and stable Respiratory status: spontaneous breathing Cardiovascular status: stable Anesthetic complications: no   No notable events documented.  Last Vitals:  Vitals:   09/30/22 0905 09/30/22 0920  BP: (!) 150/96 (!) 136/98  Pulse: 78 79  Resp: 16 14  Temp:  36.6 C  SpO2: 98% 98%    Last Pain:  Vitals:   09/30/22 0920  TempSrc:   PainSc: [place]

## 2022-09-30 NOTE — Discharge Instructions (Addendum)
[place] [address] [phone] (tel)   [phone] (fax)   POST-OPERATIVE INSTRUCTIONS - Knee Arthroscopy  WOUND CARE - You may remove the Operative Dressing on Post-Op Day #3 (72hrs after surgery).   -  Alternatively if you would like you can leave dressing on until follow-up if within 7-8 days but keep it dry. - Leave steri-strips in place until they fall off on their own, usually 2 weeks postop. - An ACE wrap may be used to control swelling, do not wrap this too tight.  If the initial ACE wrap feels too tight you may loosen it. - There may be a small amount of fluid/bleeding leaking at the surgical site.  - This is normal; the knee is filled with fluid during the procedure and can leak for 24-48hrs after surgery. You may change/reinforce the bandage as needed.  - Use the Cryocuff or Ice as often as possible for the first 7 days, then as needed for pain relief. Always keep a towel, ACE wrap or other barrier between the cooling unit and your skin.  - You may shower on Post-Op Day #3. Gently pat the area dry.  - Do not soak the knee in water or submerge it.  - Do not go swimming in the pool or ocean until 4 weeks after surgery or when otherwise instructed.  Keep incisions as dry as possible.   BRACE/AMBULATION  -            You will not need a brace after this procedure.   - You may use crutches initially to help you weight bear, but this is not required - You can put full weight on your operative leg as you feel comfortable    REGIONAL ANESTHESIA (NERVE BLOCKS) The anesthesia team may have performed a nerve block for you this is a great tool used to minimize pain.   The block may start wearing off overnight (between 8-24 hours postop) When the block wears off, your pain may go from nearly zero to the pain you would have had postop without the block. This is an abrupt transition but nothing dangerous is happening.   This can be a challenging  period but utilize your as needed pain medications to try and manage this period. We suggest you use the pain medication the first night prior to going to bed, to ease this transition.  You may take an extra dose of narcotic when this happens if needed   POST-OP MEDICATIONS- Multimodal approach to pain control In general your pain will be controlled with a combination of substances.  Prescriptions unless otherwise discussed are electronically sent to your pharmacy.  This is a carefully made plan we use to minimize narcotic use.     Norco - take this as needed for severe pain only  Zofran - take as needed for nausea Aspirin - this is for DVT prophylaxis after surgery Bactrim - this is an antibiotic, take as instructed   FOLLOW-UP   Please call the office to schedule a follow-up appointment for your incision check, 7-10 days post-operatively.   IF YOU HAVE ANY QUESTIONS, PLEASE FEEL FREE TO CALL OUR OFFICE.   HELPFUL INFORMATION   Keep your leg elevated to decrease swelling, which will then in turn decrease your pain. I would elevate the foot of your bed by putting a couple of couch pillows between your mattress and box spring. I would not keep pillow directly under your ankle.  - Do  not sleep with a pillow behind your knee even if it is more comfortable as this may make it harder to get your knee fully straight long term.   There will be MORE swelling on days 1-3 than there is on the day of surgery.  This also is normal. The swelling will decrease with the anti-inflammatory medication, ice and keeping it elevated. The swelling will make it more difficult to bend your knee. As the swelling goes down your motion will become easier   You may develop swelling and bruising that extends from your knee down to your calf and perhaps even to your foot over the next week. Do not be alarmed. This too is normal, and it is due to gravity   There may be some numbness adjacent to the incision  site. This may last for 6-12 months or longer in some patients and is expected.   You may return to sedentary work/school in the next couple of days when you feel up to it. You will need to keep your leg elevated as much as possible    You should wean off your narcotic medicines as soon as you are able.  Most patients will be off or using minimal narcotics before their first postop appointment.    We suggest you use the pain medication the first night prior to going to bed, in order to ease any pain when the anesthesia wears off. You should avoid taking pain medications on an empty stomach as it will make you nauseous.   Do not drink alcoholic beverages or take illicit drugs when taking pain medications.   It is against the law to drive while taking narcotics. You cannot drive if your Right leg is in brace locked in extension.   Pain medication may make you constipated.  Below are a few solutions to try in this order:  o Decrease the amount of pain medication if you aren't having pain.  o Drink lots of decaffeinated fluids.  o Drink prune juice and/or eat dried prunes   o If the first 3 don't work start with additional solutions  o Take Colace - an over-the-counter stool softener  o Take Senokot - an over-the-counter laxative  o Take Miralax - a stronger over-the-counter laxative

## 2022-09-30 NOTE — Interval H&P Note (Signed)
History and Physical Interval Note:  09/30/2022 7:21 AM  Brady Rivera  has presented today for surgery, with the diagnosis of RIGHT KNEE INFECTION.  The various methods of treatment have been discussed with the patient and family. After consideration of risks, benefits and other options for treatment, the patient has consented to  Procedure(s): ARTHROSCOPIC IRRIGATION AND DEBRIDEMENT RIGHT KNEE (Right) as a surgical intervention.  The patient's history has been reviewed, patient examined, no change in status, stable for surgery.  I have reviewed the patient's chart and labs.  Questions were answered to the patient's satisfaction.     Renette Butters

## 2022-09-30 NOTE — Anesthesia Procedure Notes (Signed)
Procedure Name: Intubation Date/Time: 09/30/2022 7:45 AM  Performed by: Wilburn Cornelia, CRNAPre-anesthesia Checklist: Patient identified, Emergency Drugs available, Suction available, Patient being monitored and Timeout performed Patient Re-evaluated:Patient Re-evaluated prior to induction Oxygen Delivery Method: Circle system utilized Preoxygenation: Pre-oxygenation with 100% oxygen Induction Type: IV induction, Rapid sequence and Cricoid Pressure applied Laryngoscope Size: Mac and 4 Grade View: Grade I Tube type: Oral Tube size: 7.5 mm Number of attempts: 1 Airway Equipment and Method: Stylet Placement Confirmation: ETT inserted through vocal cords under direct vision, positive ETCO2, CO2 detector and breath sounds checked- equal and bilateral Secured at: 23 cm Tube secured with: Tape Dental Injury: Teeth and Oropharynx as per pre-operative assessment

## 2022-10-01 ENCOUNTER — Encounter (HOSPITAL_COMMUNITY): Payer: Self-pay | Admitting: Orthopedic Surgery

## 2022-12-28 ENCOUNTER — Encounter: Payer: Self-pay | Admitting: Infectious Disease

## 2022-12-28 DIAGNOSIS — T8459XA Infection and inflammatory reaction due to other internal joint prosthesis, initial encounter: Secondary | ICD-10-CM

## 2022-12-28 HISTORY — DX: Infection and inflammatory reaction due to other internal joint prosthesis, initial encounter: T84.59XA

## 2022-12-29 ENCOUNTER — Encounter: Payer: Self-pay | Admitting: Infectious Disease

## 2022-12-29 ENCOUNTER — Ambulatory Visit (INDEPENDENT_AMBULATORY_CARE_PROVIDER_SITE_OTHER): Payer: Commercial Managed Care - HMO | Admitting: Infectious Disease

## 2022-12-29 ENCOUNTER — Telehealth: Payer: Self-pay

## 2022-12-29 ENCOUNTER — Other Ambulatory Visit: Payer: Self-pay

## 2022-12-29 VITALS — BP 121/87 | HR 85 | Temp 98.0°F | Ht 68.0 in | Wt 164.0 lb

## 2022-12-29 DIAGNOSIS — Z96659 Presence of unspecified artificial knee joint: Secondary | ICD-10-CM

## 2022-12-29 DIAGNOSIS — Z79899 Other long term (current) drug therapy: Secondary | ICD-10-CM | POA: Diagnosis not present

## 2022-12-29 DIAGNOSIS — T8459XA Infection and inflammatory reaction due to other internal joint prosthesis, initial encounter: Secondary | ICD-10-CM

## 2022-12-29 DIAGNOSIS — Z114 Encounter for screening for human immunodeficiency virus [HIV]: Secondary | ICD-10-CM

## 2022-12-29 HISTORY — DX: Other long term (current) drug therapy: Z79.899

## 2022-12-29 HISTORY — DX: Encounter for screening for human immunodeficiency virus (HIV): Z11.4

## 2022-12-29 MED ORDER — DOXYCYCLINE HYCLATE 100 MG PO TABS: 100.0000 mg | ORAL_TABLET | Freq: Two times a day (BID) | ORAL | 1 refills | Status: DC

## 2022-12-29 MED ORDER — DEXTROSE 5 % IV SOLN: 2.0000 g | INTRAVENOUS | 0 refills | Status: DC

## 2022-12-29 NOTE — Telephone Encounter (Signed)
Thank you :)

## 2022-12-29 NOTE — Progress Notes (Addendum)
Subjective:  Reason for infectious disease consult: Prosthetic joint infection  Requesting physician Brady Lynch, MD   Patient ID: Brady Rivera, male    DOB: 04-Jan-1986, 37 y.o.   MRN: [id number]  HPI   Brady Rivera is a 37 year old black man with past medical history significant for seasonal allergies who is on preexposure prophylaxis for HIV via the health department at [facility] hex with DESCOVY.  He developed acute swelling and pain of his right knee this past October.  The joint was aspirated and had high white count with PMN predominance.  Cultures from Dr. Debroah Rivera office were negative.  He was taken to the operating room and underwent scopic I&D on September 30, 2022.  New cultures in the operating room were not taken at that time.  He was given a shot of ceftriaxone intramuscularly and prescribed cephalexin which she took for 4 to 6 weeks although there may not have Rivera perfect adherence.  He is still had persistent pain and undergone aspiration of the joint.  This was done Dr. Debroah Rivera office on 10 January aspiration revealed 16,365 blood cells with 62% neutrophils and 35% lymphocytes.  No crystals were seen and no organism grew on culture.  He had a sed rate drawn in their office as well which was normal at 14 and a CRP that was normal at 7.9 CBC with differential was normal as well.  BMP was not done.  I am wondering if it is possible that Brady Rivera had disseminated gonococcal infection given his risk for gonorrhea/with having sex with other men.  He is regularly tested by Brady Rivera with both genital and extragenital gonorrhea and chlamydia amplification.  That being said it is certainly possible he could have had a disseminated gonococcal infection at the time he had a septic knee and that this was partially treated by 1 g or 2 g of ceftriaxone IM given in the orthopedic surgery office.  Also possibly could have a Staph epidermidis or streptococcal infection that is  partially treated though I think that is not as likely.  Finally Lyme arthritis would be possible though he has no history of erythema migrans and no recollection of recent tick bites.    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    Social History   Socioeconomic History   Marital status: Single    Spouse name: Not on file   Number of children: Not on file   Years of education: Not on file   Highest education level: Not on file  Occupational History   Not on file  Tobacco Use   Smoking status: Never    Passive exposure: Never   Smokeless tobacco: Never  Vaping Use   Vaping Use: Never used  Substance and Sexual Activity   Alcohol use: Yes    Comment: socially   Drug use: No   Sexual activity: Not on file  Other Topics Concern   Not on file  Social History Narrative   Not on file   Social Determinants of Health   Financial Resource Strain: Not  on file  Food Insecurity: Not on file  Transportation Needs: Not on file  Physical Activity: Not on file  Stress: Not on file  Social Connections: Not on file    No Known Allergies   Current Outpatient Medications:    albuterol (VENTOLIN HFA) 108 (90 Base) MCG/ACT inhaler, Inhale 2 puffs into the lungs every 4 (four) hours as needed for wheezing or shortness of breath., Disp: 1 each, Rfl: 0   DESCOVY 200-25 MG tablet, Take 1 tablet by mouth daily., Disp: , Rfl:    diphenhydrAMINE (BENADRYL) 25 MG tablet, Take 50 mg by mouth every 6 (six) hours as needed for allergies., Disp: , Rfl:    fluticasone (FLONASE) 50 MCG/ACT nasal spray, Place 2  sprays into both nostrils daily as needed for allergies or rhinitis., Disp: , Rfl:    HYDROcodone-acetaminophen (NORCO) 5-325 MG tablet, Take 1 tablet by mouth every 6 (six) hours as needed for severe pain., Disp: 20 tablet, Rfl: 0   ibuprofen (ADVIL) 600 MG tablet, Take 1 tablet (600 mg total) by mouth every 8 (eight) hours as needed., Disp: 30 tablet, Rfl: 0   Menthol, Topical Analgesic, (ICY HOT EX), Apply 1 Application topically daily as needed., Disp: , Rfl:    Menthol-Methyl Salicylate (MUSCLE RUB) 10-15 % CREA, Apply 1 Application topically as needed for muscle pain., Disp: , Rfl:    Review of Systems  Constitutional:  Negative for activity change, appetite change, chills, diaphoresis, fatigue, fever and unexpected weight change.  HENT:  Negative for congestion, rhinorrhea, sinus pressure, sneezing, sore throat and trouble swallowing.   Eyes:  Negative for photophobia and visual disturbance.  Respiratory:  Negative for cough, chest tightness, shortness of breath, wheezing and stridor.   Cardiovascular:  Negative for chest pain, palpitations and leg swelling.  Gastrointestinal:  Negative for abdominal distention, abdominal pain, anal bleeding, blood in stool, constipation, diarrhea, nausea and vomiting.  Genitourinary:  Negative for difficulty urinating, dysuria, flank pain and hematuria.  Musculoskeletal:  Positive for joint swelling. Negative for arthralgias, back pain, gait problem and myalgias.  Skin:  Negative for color change, pallor, rash and wound.  Neurological:  Negative for dizziness, tremors, weakness and light-headedness.  Hematological:  Negative for adenopathy. Does not bruise/bleed easily.  Psychiatric/Behavioral:  Negative for agitation, behavioral problems, confusion, decreased concentration, dysphoric mood and sleep disturbance.        Objective:   Physical Exam Constitutional:      Appearance: He is well-developed.  HENT:     Head: Normocephalic and atraumatic.   Eyes:     Conjunctiva/sclera: Conjunctivae normal.  Cardiovascular:     Rate and Rhythm: Normal rate and regular rhythm.  Pulmonary:     Effort: Pulmonary effort is normal. No respiratory distress.     Breath sounds: No wheezing.  Abdominal:     General: There is no distension.     Palpations: Abdomen is soft.  Musculoskeletal:        General: Normal range of motion.     Cervical back: Normal range of motion and neck supple.     Right knee: Swelling present. Tenderness present.  Skin:    General: Skin is warm and dry.     Coloration: Skin is not pale.     Findings: No erythema or rash.  Neurological:     General: No focal deficit present.     Mental Status: He is alert and oriented to person, place, and time.  Psychiatric:  Mood and Affect: Mood normal.        Behavior: Behavior normal.        Thought Content: Thought content normal.        Judgment: Judgment normal.           Assessment & Plan:   Culture negative septic arthritis:  I wonder if this could have Rivera DGI that was not completely trearted but which may have responded partially to Ceftriaxone IM in orthopedics  I doubt Lyme though I doubt it I will check Lyme titers.  I will check a BMP with GFR.  I also wonder about inflammatory arthritis but he should have had elevated inflammatory markers I will she will still check a rheumatoid factor and ANCA profile.  I will check an MRI of his knee to look for osteomyelitis though that would seem very unlikely as well.  I think given that still has abnormal cells in the joint and he has not Rivera completely thoroughly treated for potential disseminated gonococcal infection and/or Staph epidermidis or streptococcal septic arthritis I think giving him a 6-week course of ceftriaxone with doxycycline would be reasonable.  I will also have him follow-up with my partner Dr. Gale Journey.  We are putting in orders for PICC line so that he can have ceftriaxone.  He will  have to keep this area clean as he does work as a Training and development officer.    Diagnosis: Septic arthritis with negative cultures  Culture Result: No growth  No Known Allergies  OPAT Orders Discharge antibiotics to be given via PICC line Discharge antibiotics: Ceftriaxone 2 g IV daily x 42 days plus doxycycline 100 mg twice daily x 42 days  Duration: 6 weeks End Date: February 18, 2023  [place] Per Protocol:  Home health RN for IV administration and teaching; PICC line care and labs.    Labs weekly while on IV antibiotics: __x CBC with differential _x_ BMP  _x_ CRP _x_ ESR    _x_ Please pull PIC at completion of IV antibiotics __ Please leave PIC in place until doctor has seen patient or Rivera notified  Fax weekly labs to [phone]  Clinic Follow Up Appt: With Dr. Gale Journey  On PrEP: Gust options of long-acting PrEP including Apretude but he prefers DESCOVY.  He is quite happy and happy with care is receiving with Brady Rivera.  We will endeavor to get records from her though because would like to see with GC and Chlamydia testing revealed.  I spent 74minutes with the patient including than 50% of the time in face to face counseling of the patient regarding the potential causes of his presumably septic arthritis, including DGI Lyme or bacterial infection that was simply not growing on culture inflammatory arthritis, personally reviewing radiographs along with review of medical records in preparation for the visit and during the visit and in coordination of his care with Dr. Percell Miller.

## 2022-12-29 NOTE — Addendum Note (Signed)
Addended by: Caffie Pinto on: 12/29/2022 11:04 AM   Modules accepted: Orders

## 2022-12-29 NOTE — Telephone Encounter (Signed)
New OPAT orders per Dr. Tommy Medal, orders shared with Carolynn Sayers, RN at [facility] and [place] staff.   IR appointment: January 29 @ 2 pm   - appointment time and location provided to both patient and [facility].   First dose: in home - [facility]

## 2023-01-01 ENCOUNTER — Telehealth: Payer: Self-pay | Admitting: Infectious Disease

## 2023-01-01 ENCOUNTER — Encounter: Payer: Self-pay | Admitting: Infectious Disease

## 2023-01-01 ENCOUNTER — Telehealth: Payer: Self-pay

## 2023-01-01 ENCOUNTER — Ambulatory Visit: Payer: Commercial Managed Care - HMO | Attending: Internal Medicine | Admitting: Internal Medicine

## 2023-01-01 ENCOUNTER — Encounter: Payer: Self-pay | Admitting: Internal Medicine

## 2023-01-01 VITALS — BP 133/94 | HR 88 | Temp 97.9°F | Ht 68.0 in | Wt 162.0 lb

## 2023-01-01 DIAGNOSIS — A6923 Arthritis due to Lyme disease: Secondary | ICD-10-CM

## 2023-01-01 DIAGNOSIS — E739 Lactose intolerance, unspecified: Secondary | ICD-10-CM | POA: Diagnosis not present

## 2023-01-01 DIAGNOSIS — K219 Gastro-esophageal reflux disease without esophagitis: Secondary | ICD-10-CM | POA: Diagnosis not present

## 2023-01-01 DIAGNOSIS — Z7689 Persons encountering health services in other specified circumstances: Secondary | ICD-10-CM

## 2023-01-01 DIAGNOSIS — Z8739 Personal history of other diseases of the musculoskeletal system and connective tissue: Secondary | ICD-10-CM

## 2023-01-01 DIAGNOSIS — R030 Elevated blood-pressure reading, without diagnosis of hypertension: Secondary | ICD-10-CM

## 2023-01-01 DIAGNOSIS — Z2821 Immunization not carried out because of patient refusal: Secondary | ICD-10-CM | POA: Diagnosis not present

## 2023-01-01 HISTORY — DX: Arthritis due to Lyme disease: A69.23

## 2023-01-01 NOTE — Telephone Encounter (Signed)
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

## 2023-01-01 NOTE — Patient Instructions (Signed)

## 2023-01-01 NOTE — Telephone Encounter (Signed)
Relayed positive Lyme results, advised patient that Dr. Tommy Medal sent in oral doxycycline for him to take in the meantime until his PICC is placed and he starts on IV ceftriaxone. Patient verbalized understanding.  Has questions about returning to work. Provided him with phone number to [facility] to ask about home-bound status.  Beryle Flock, RN

## 2023-01-01 NOTE — Telephone Encounter (Signed)
-----  Message from Truman Hayward, MD sent at 01/01/2023  4:27 PM EST ----- Patient's IgG to Lyme is FLORIDLY POSITIVE. I think his culture negative septic arthritis is due to Lyme. Did he get PICC line? If he does Ceftriaxone IV would be drug of choice. He can take doxycylien that I rx also in the meantime ----- Message ----- From: Interface, [facility] Lab Results In Sent: 12/29/2022   3:01 PM EST To: Truman Hayward, MD

## 2023-01-01 NOTE — Progress Notes (Signed)
Patient ID: DANNER PAULDING, male    DOB: 11-18-1986  MRN: [id number]  CC: Establish Care (Est care / New pt./No to flu vax. )   Subjective: Brady Rivera is a 37 y.o. male who presents for new pt visit His concerns today include:  Patient with history of infected right knee jt 09/2022, PreP therapy through HD  Patient presents today to establish care. No previous PCP  Patient with infection of right knee for which he was taken to the operating room 09/30/2022 by Dr. Edmonia Lynch for arthroscopic irrigation and debridement.  Previous cultures from office was negative.  Seen by ID Dr. Drucilla Schmidt 12/29/2022.  Question of disseminated gonococcal infection versus inflammatory arthritis versus Lyme.  Lyme titers pending.  Plan is for Rocephin 2 g IV daily  plus doxycycline 100 mg twice a day for 42 days.  Expected end date is 02/18/2023 Knee still a little swollen Pt had questions about when PICC line be placed and abx started.  On review of his chart, I see that he has an appointment scheduled next Monday for placement of PICC line then RN from [facility] should be going out to start him on the Rocephin.   BP elev today at 133/94. Pt reports BP elev since issue with knee Pt admits to using more salt than he should in his foods. No BP device at home.   Hx of GERD.  Tries to avoid spicy foods but loves spicy foods.  Thinks he is lactose intolerant.  Loves milk, ice cream and cheese  HM: declines flu shot.  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 1 each 0   DESCOVY 200-25 MG  tablet Take 1 tablet by mouth daily.     diphenhydrAMINE (BENADRYL) 25 MG tablet Take 50 mg by mouth every 6 (six) hours as needed for allergies.     doxycycline (VIBRA-TABS) 100 MG tablet Take 1 tablet (100 mg total) by mouth 2 (two) times daily. 84 tablet 1   Menthol, Topical Analgesic, (ICY HOT EX) Apply 1 Application topically daily as needed.     Menthol-Methyl Salicylate (MUSCLE RUB) 10-15 % CREA Apply 1 Application topically as needed for muscle pain.     cefTRIAXone 2 g in dextrose 5 % 50 mL Inject 2 g into the vein daily. (Patient not taking: Reported on 01/01/2023) 42 Bag 0   fluticasone (FLONASE) 50 MCG/ACT nasal spray Place 2 sprays into both nostrils daily as needed for allergies or rhinitis.     HYDROcodone-acetaminophen (NORCO) 5-325 MG tablet Take 1 tablet by mouth every 6 (six) hours as needed for severe pain. (Patient not taking: Reported on 12/29/2022) 20 tablet 0   ibuprofen (ADVIL) 600 MG tablet Take 1 tablet (600 mg total) by mouth every 8 (eight) hours as needed. (Patient not taking: Reported on 01/01/2023) 30 tablet 0   No current facility-administered medications on file prior to visit.    No Known Allergies  Social History   Socioeconomic History   Marital status: Single    Spouse name: Not on file   Number  of children: Not on file   Years of education: Not on file   Highest education level: Not on file  Occupational History   Not on file  Tobacco Use   Smoking status: Never    Passive exposure: Never   Smokeless tobacco: Never  Vaping Use   Vaping Use: Never used  Substance and Sexual Activity   Alcohol use: Yes    Comment: socially   Drug use: No   Sexual activity: Not on file  Other Topics Concern   Not on file  Social History Narrative   Not on file   Social Determinants of Health   Financial Resource Strain: Not on file  Food Insecurity: Not on file  Transportation Needs: Not on file  Physical Activity: Not on file  Stress: Not on file   Social Connections: Not on file  Intimate Partner Violence: Not on file    Family History  Problem Relation Age of Onset   Diabetes Mother    High blood pressure Mother    Diabetes Father    High blood pressure Father     Past Surgical History:  Procedure Laterality Date   KNEE ARTHROSCOPY Right 09/30/2022   Procedure: ARTHROSCOPIC IRRIGATION AND DEBRIDEMENT RIGHT KNEE;  Surgeon: Renette Butters, MD;  Location: [facility];  Service: Orthopedics;  Laterality: Right;    ROS: Review of Systems Negative except as stated above  PHYSICAL EXAM: BP (!) 133/94 (BP Location: Left Arm, Patient Position: Sitting, Cuff Size: Normal)   Pulse 88   Temp 97.9 F (36.6 C) (Oral)   Ht 5\' 8"  (1.727 m)   Wt 162 lb (73.5 kg)   SpO2 98%   BMI 24.63 kg/m   Physical Exam   General appearance - alert, well appearing, and in no distress Mental status - normal mood, behavior, speech, dress, motor activity, and thought processes Neck - supple, no significant adenopathy Chest - clear to auscultation, no wheezes, rales or rhonchi, symmetric air entry Heart - normal rate, regular rhythm, normal S1, S2, no murmurs, rubs, clicks or gallops Musculoskeletal -right knee: Some soft tissue swelling.  No erythema Extremities -no lower extremity edema     Latest Ref Rng & Units 12/29/2022   11:00 AM 09/06/2022    6:03 PM 03/25/2015    9:20 AM  CMP  Glucose 65 - 99 mg/dL 100  93  106   BUN 7 - 25 mg/dL 15  12  25    Creatinine 0.60 - 1.26 mg/dL 1.06  1.03  0.97   Sodium 135 - 146 mmol/L 142  139  140   Potassium 3.5 - 5.3 mmol/L 4.4  4.4  4.5   Chloride 98 - 110 mmol/L 100  97  101   CO2 20 - 32 mmol/L 34  30  30   Calcium 8.6 - 10.3 mg/dL 9.2  9.4  9.2   Total Protein 6.0 - 8.3 g/dL   7.1   Total Bilirubin 0.3 - 1.2 mg/dL   0.5   Alkaline Phos 39 - 117 U/L   75   AST 0 - 37 U/L   29   ALT 0 - 53 U/L   19    Lipid Panel  No results found for: "CHOL", "TRIG", "HDL", "CHOLHDL", "VLDL", "LDLCALC",  "LDLDIRECT"  CBC    Component Value Date/Time   WBC 10.2 09/06/2022 1803   RBC 4.52 09/06/2022 1803   HGB 14.7 09/06/2022 1803   HCT 44.6 09/06/2022 1803   PLT 381  09/06/2022 1803   MCV 98.7 09/06/2022 1803   MCH 32.5 09/06/2022 1803   MCHC 33.0 09/06/2022 1803   RDW 12.4 09/06/2022 1803    ASSESSMENT AND PLAN: 1. Establishing care with new doctor, encounter for   2. Personal history of infected joint I went over with him what I reviewed in the chart in terms of the date when he is to have the PICC line placed.  Advised him that he has to go to [facility] radiology on that date at 2 PM for PICC line placement.  He should stop in ID office downstairs today to verify that an RN will come out that day or the day after to get him started with today daily IV Rocephin  3. Elevated blood-pressure reading without diagnosis of hypertension DASH diet discussed and encouraged.  Follow-up with clinical pharmacist in several weeks for repeat blood pressure check.  4. Gastroesophageal reflux disease without esophagitis GERD precautions discussed including foods to avoid.  Recommend over-the-counter omeprazole  5. Lactose intolerance Try to avoid dairy products.  Recommend switching to Lactaid milk or almond milk.  6. Influenza vaccination declined    Patient was given the opportunity to ask questions.  Patient verbalized understanding of the plan and was able to repeat key elements of the plan.   This documentation was completed using Paediatric nurse.  Any transcriptional errors are unintentional.  No orders of the defined types were placed in this encounter.    Requested Prescriptions    No prescriptions requested or ordered in this encounter    No follow-ups on file.  Jonah Blue, MD, FACP

## 2023-01-02 LAB — B. BURGDORFI ANTIBODIES BY WB
B burgdorferi IgG Abs (IB): POSITIVE — AB
B burgdorferi IgM Abs (IB): POSITIVE — AB
Lyme Disease 18 kD IgG: REACTIVE — AB
Lyme Disease 23 kD IgG: REACTIVE — AB
Lyme Disease 23 kD IgM: REACTIVE — AB
Lyme Disease 28 kD IgG: REACTIVE — AB
Lyme Disease 30 kD IgG: REACTIVE — AB
Lyme Disease 39 kD IgG: REACTIVE — AB
Lyme Disease 39 kD IgM: REACTIVE — AB
Lyme Disease 41 kD IgG: REACTIVE — AB
Lyme Disease 41 kD IgM: NONREACTIVE
Lyme Disease 45 kD IgG: REACTIVE — AB
Lyme Disease 58 kD IgG: REACTIVE — AB
Lyme Disease 66 kD IgG: REACTIVE — AB
Lyme Disease 93 kD IgG: REACTIVE — AB

## 2023-01-02 LAB — BASIC METABOLIC PANEL WITH GFR
BUN: 15 mg/dL (ref 7–25)
CO2: 34 mmol/L — ABNORMAL HIGH (ref 20–32)
Calcium: 9.2 mg/dL (ref 8.6–10.3)
Chloride: 100 mmol/L (ref 98–110)
Creat: 1.06 mg/dL (ref 0.60–1.26)
Glucose, Bld: 100 mg/dL — ABNORMAL HIGH (ref 65–99)
Potassium: 4.4 mmol/L (ref 3.5–5.3)
Sodium: 142 mmol/L (ref 135–146)
eGFR: 93 mL/min/{1.73_m2} (ref >=60)

## 2023-01-02 LAB — PAN-ANCA
ANCA SCREEN: NEGATIVE
Myeloperoxidase Abs: <1 AI (ref <1.0)
Serine Protease 3: <1 AI (ref <1.0)

## 2023-01-02 LAB — RHEUMATOID FACTOR: Rheumatoid fact SerPl-aCnc: <14 IU/mL (ref <14)

## 2023-01-02 LAB — LYME AB SCREEN RFLX: Lyme AB Screen: 8.51 index — ABNORMAL HIGH

## 2023-01-08 ENCOUNTER — Other Ambulatory Visit: Payer: Self-pay | Admitting: Infectious Disease

## 2023-01-08 ENCOUNTER — Ambulatory Visit (HOSPITAL_COMMUNITY)
Admission: RE | Admit: 2023-01-08 | Discharge: 2023-01-08 | Disposition: A | Discharge status: Home / Self Care | Payer: Commercial Managed Care - HMO | Source: Ambulatory Visit | Attending: Infectious Disease | Admitting: Infectious Disease

## 2023-01-08 DIAGNOSIS — Z79899 Other long term (current) drug therapy: Secondary | ICD-10-CM

## 2023-01-08 DIAGNOSIS — Z96659 Presence of unspecified artificial knee joint: Secondary | ICD-10-CM

## 2023-01-08 DIAGNOSIS — T8459XA Infection and inflammatory reaction due to other internal joint prosthesis, initial encounter: Secondary | ICD-10-CM | POA: Diagnosis not present

## 2023-01-08 DIAGNOSIS — Y839 Surgical procedure, unspecified as the cause of abnormal reaction of the patient, or of later complication, without mention of misadventure at the time of the procedure: Secondary | ICD-10-CM | POA: Insufficient documentation

## 2023-01-08 DIAGNOSIS — Z114 Encounter for screening for human immunodeficiency virus [HIV]: Secondary | ICD-10-CM

## 2023-01-08 MED ORDER — HEPARIN SOD (PORK) LOCK FLUSH 100 UNIT/ML IV SOLN
INTRAVENOUS | Status: AC
  Filled 2023-01-08: qty 5

## 2023-01-08 MED ORDER — LIDOCAINE HCL 1 % IJ SOLN
INTRAMUSCULAR | Status: AC
  Administered 2023-01-08: 10 mL
  Filled 2023-01-08: qty 20

## 2023-01-08 MED ORDER — HEPARIN SOD (PORK) LOCK FLUSH 100 UNIT/ML IV SOLN
INTRAVENOUS | Status: AC
  Administered 2023-01-08: 500 [IU]
  Filled 2023-01-08: qty 5

## 2023-01-08 NOTE — Procedures (Signed)
PROCEDURE SUMMARY:  Successful placement of image-guided single lumen PICC line to the right brachial vein. Length 35 cm. Tip at lower SVC/RA. No complications. EBL = <2 ml. Ready for use.  Please see imaging section of Epic for full dictation.   Theresa Duty, NP 01/08/2023 3:14 PM

## 2023-01-24 ENCOUNTER — Ambulatory Visit (HOSPITAL_COMMUNITY): Payer: Commercial Managed Care - HMO

## 2023-01-30 ENCOUNTER — Other Ambulatory Visit: Payer: Self-pay

## 2023-01-30 ENCOUNTER — Telehealth: Payer: Self-pay

## 2023-01-30 ENCOUNTER — Encounter: Payer: Self-pay | Admitting: Internal Medicine

## 2023-01-30 ENCOUNTER — Ambulatory Visit (INDEPENDENT_AMBULATORY_CARE_PROVIDER_SITE_OTHER): Payer: Commercial Managed Care - HMO | Admitting: Internal Medicine

## 2023-01-30 VITALS — BP 114/79 | HR 106 | Temp 98.3°F | Ht 68.0 in | Wt 164.0 lb

## 2023-01-30 DIAGNOSIS — M009 Pyogenic arthritis, unspecified: Secondary | ICD-10-CM | POA: Diagnosis not present

## 2023-01-30 NOTE — Patient Instructions (Signed)
End of therapy is on 02/18/23   While the infection would be easily treated, the actual pain/swelling time response might take longer   Please let your surgeon know if he will aspirate your knee to again send for bacterial, afb, fungal culture, and also (with priority) lyme pcr   See [place] again in 3 weeks

## 2023-01-30 NOTE — Telephone Encounter (Signed)
Orders sent to Carolynn Sayers, RN with [facility] that okay to pull PICC after last dose on 02/18/23 per Dr. Gale Journey.   Beryle Flock, RN

## 2023-01-30 NOTE — Progress Notes (Signed)
Subjective:  Reason for infectious disease consult: Prosthetic joint infection  Requesting physician Edmonia Lynch, MD   Patient ID: Brady Rivera, male    DOB: 11/11/1986, 37 y.o.   MRN: [id number]  HPI   Brady Rivera is a 37 year old black man with past medical history significant for seasonal allergies who is on preexposure prophylaxis for HIV via the health department at [facility] hex with DESCOVY.  He developed acute swelling and pain of his right knee this past October.  The joint was aspirated and had high white count with PMN predominance.  Cultures from Dr. Debroah Loop office were negative.  He was taken to the operating room and underwent scopic I&D on September 30, 2022.  New cultures in the operating room were not taken at that time.  He was given a shot of ceftriaxone intramuscularly and prescribed cephalexin which she took for 4 to 6 weeks although there may not have been perfect adherence.  He is still had persistent pain and undergone aspiration of the joint.  This was done Dr. Debroah Loop office on 10 January aspiration revealed 16,365 blood cells with 62% neutrophils and 35% lymphocytes.  No crystals were seen and no organism grew on culture.  He had a sed rate drawn in their office as well which was normal at 14 and a CRP that was normal at 7.9 CBC with differential was normal as well.  BMP was not done.  I am wondering if it is possible that Brady Rivera had disseminated gonococcal infection given his risk for gonorrhea/with having sex with other men.  He is regularly tested by [facility] with both genital and extragenital gonorrhea and chlamydia amplification.  That being said it is certainly possible he could have had a disseminated gonococcal infection at the time he had a septic knee and that this was partially treated by 1 g or 2 g of ceftriaxone IM given in the orthopedic surgery office.  Also possibly could have a Staph epidermidis or streptococcal infection that is  partially treated though I think that is not as likely.  Finally Lyme arthritis would be possible though he has no history of erythema migrans and no recollection of recent tick bites.  --------------- 01/30/23 id f/u He is tolerating Ceftriaxone and doxycycline. 6 weeks eot planned for 3/10.  I reviewed lyme exposure 2-3 months before onset of knee pain (noticed tick on his right arm). Exposure happened in [place]. No other prior exposure. 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 [place] in [place], [place]. He did visited [place] this past year several months before onset of arthritis, in the spring 2023; was there for 2 weeks  Initial dx septic arthritis 09/30/22 -- operative cx or aspirate cx negative.  He noticed a rash after on his arm at the tick bite pink which self resolved. No numbness/tingling/palpitation at that time.  Lyme serology igm and igg positive 12/29/22. At least 5-6 months after initial exposure, which for the igm it is rather unusual. But the onset of arthritis does correlate. Also rare for primary lyme to occur in [place]   Today he reports has 2 weeks worsening swelling of the right knee. No other joint pain. Knee pain moderate to severe and corresponds with the swelling level No fever/chill. 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   On pre-exposure prophylaxis for HIV 12/29/2022   Screening for HIV (human immunodeficiency virus) 12/29/2022    Past Surgical History:  Procedure Laterality Date   KNEE ARTHROSCOPY Right 09/30/2022   Procedure: ARTHROSCOPIC  IRRIGATION AND DEBRIDEMENT RIGHT KNEE;  Surgeon: Renette Butters, MD;  Location: [facility];  Service: Orthopedics;  Laterality: Right;    Family History  Problem Relation Age of Onset   Diabetes Mother    High blood pressure Mother    Diabetes Father    High blood pressure Father       Social History   Socioeconomic History   Marital status: Single    Spouse name: Not on file   Number of children: Not on file   Years of education: Not on file   Highest education level: Not on file  Occupational History   Not on file  Tobacco Use   Smoking status: Never    Passive exposure: Never   Smokeless tobacco: Never  Vaping Use   Vaping Use: Never used  Substance and Sexual Activity   Alcohol use: Yes    Comment: socially   Drug use: No   Sexual activity: Not on file  Other Topics Concern   Not on file  Social History Narrative   Not on file   Social Determinants of Health   Financial Resource Strain: Not on file  Food Insecurity: Not on file  Transportation Needs: Not on file  Physical Activity: Not on file  Stress: Not on file  Social Connections: Not on file    No Known Allergies   Current Outpatient Medications:    albuterol (VENTOLIN HFA) 108 (90 Base) MCG/ACT inhaler, Inhale 2 puffs into the lungs every 4 (four) hours as needed for wheezing or shortness of breath., Disp: 1 each, Rfl: 0   cefTRIAXone 2 g in dextrose 5 % 50 mL, Inject 2 g into the vein daily., Disp: 42 Bag, Rfl: 0   DESCOVY 200-25 MG tablet, Take 1 tablet by mouth daily., Disp: , Rfl:    diphenhydrAMINE (BENADRYL) 25 MG tablet, Take 50 mg by mouth every 6 (six) hours as needed for allergies., Disp: , Rfl:    doxycycline (VIBRA-TABS) 100 MG tablet, Take 1 tablet (100 mg total) by mouth 2 (two) times daily., Disp: 84 tablet, Rfl: 1   fluticasone (FLONASE) 50 MCG/ACT nasal spray, Place 2 sprays into both nostrils daily as needed for allergies or rhinitis. (Patient not taking: Reported on 01/30/2023), Disp:  , Rfl:    Menthol, Topical Analgesic, (ICY HOT EX), Apply 1 Application topically daily as needed. (Patient not taking: Reported on 01/30/2023), Disp: , Rfl:    Menthol-Methyl Salicylate (MUSCLE RUB) 10-15 % CREA, Apply 1 Application topically as needed for muscle pain. (Patient not taking: Reported on 01/30/2023), Disp: , Rfl:    Review of Systems  Constitutional:  Negative for activity change, appetite change, chills, diaphoresis, fatigue, fever and unexpected weight change.  HENT:  Negative for congestion, rhinorrhea, sinus pressure, sneezing, sore throat and trouble swallowing.   Eyes:  Negative for photophobia and visual disturbance.  Respiratory:  Negative for cough, chest tightness, shortness of breath, wheezing and stridor.   Cardiovascular:  Negative for chest pain, palpitations and leg swelling.  Gastrointestinal:  Negative for abdominal distention, abdominal pain, anal bleeding, blood in stool, constipation, diarrhea, nausea and vomiting.  Genitourinary:  Negative for difficulty urinating, dysuria, flank pain and hematuria.  Musculoskeletal:  Positive for joint swelling. Negative for arthralgias, back pain, gait problem and myalgias.  Skin:  Negative for color change, pallor, rash and wound.  Neurological:  Negative for dizziness, tremors, weakness and light-headedness.  Hematological:  Negative for adenopathy. Does not bruise/bleed easily.  Psychiatric/Behavioral:  Negative for agitation, behavioral problems, confusion, decreased concentration, dysphoric mood and sleep disturbance.        Objective:   Physical Exam Constitutional:      Appearance: He is well-developed.  HENT:     Head: Normocephalic and atraumatic.  Eyes:     Conjunctiva/sclera: Conjunctivae normal.  Cardiovascular:     Rate and Rhythm: Normal rate and regular rhythm.  Pulmonary:     Effort: Pulmonary effort is normal. No respiratory distress.     Breath sounds: No wheezing.  Abdominal:     General: There  is no distension.     Palpations: Abdomen is soft.  Musculoskeletal:        General: Normal range of motion.     Cervical back: Normal range of motion and neck supple.     Right knee: Swelling present. Tenderness present.  Skin:    General: Skin is warm and dry.     Coloration: Skin is not pale.     Findings: No erythema or rash.  Neurological:     General: No focal deficit present.     Mental Status: He is alert and oriented to person, place, and time.  Psychiatric:        Mood and Affect: Mood normal.        Behavior: Behavior normal.        Thought Content: Thought content normal.        Judgment: Judgment normal.           Assessment & Plan:   Culture negative septic arthritis:  I wonder if this could have been DGI that was not completely trearted but which may have responded partially to Ceftriaxone IM in orthopedics  I doubt Lyme though I doubt it I will check Lyme titers.  I will check a BMP with GFR.  I also wonder about inflammatory arthritis but he should have had elevated inflammatory markers I will she will still check a rheumatoid factor and ANCA profile.  I will check an MRI of his knee to look for osteomyelitis though that would seem very unlikely as well.  I think given that still has abnormal cells in the joint and he has not been completely thoroughly treated for potential disseminated gonococcal infection and/or Staph epidermidis or streptococcal septic arthritis I think giving him a 6-week course of ceftriaxone with doxycycline would be reasonable.  I will also have him follow-up with my partner Dr. Gale Journey.  We are putting in orders for PICC line so that he can have ceftriaxone.  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    Diagnosis: Septic arthritis with negative cultures  Culture Result: No growth   --------------- 01/30/23 id assessment Right knee acute monoarticular arthritis suggestive of septic arthritis; positive  lyme serology igg/igm and also probable temporally fit exposure Discuss lyme treatment would have killed the bacterial by now; however, the arthritis response unclear if will take a little longer to improve  Swelling last 2 weeks patient  to call ortho for aspiration  Given potential location not quite suggestive, and for semantics purpose, if knee is to be aspirated again, would recommend sending for lyme pcr with priority, then bacterial, fungal, and afb culture  Will send chart to dr Edmonia Lynch as well  Elizebeth Koller ceftriaxone/doxy (would keep broad) given some potential ambiguity, until 02/18/23  Follow up with either me or dr Tommy Medal 3 weeks from now   He is on prep therapy and to continue descovy as previous  I have spent a total of 40 minutes of face-to-face and non-face-to-face time, excluding clinical staff time, preparing to see patient, ordering tests and/or medications, and provide counseling the patient

## 2023-02-01 ENCOUNTER — Ambulatory Visit
Admission: RE | Admit: 2023-02-01 | Discharge: 2023-02-01 | Disposition: A | Discharge status: Home / Self Care | Payer: Commercial Managed Care - HMO | Source: Ambulatory Visit | Attending: Infectious Disease | Admitting: Infectious Disease

## 2023-02-01 DIAGNOSIS — T8459XA Infection and inflammatory reaction due to other internal joint prosthesis, initial encounter: Secondary | ICD-10-CM

## 2023-02-01 MED ORDER — GADOPICLENOL 0.5 MMOL/ML IV SOLN
7.5000 mL | Freq: Once | INTRAVENOUS | Status: AC | PRN
  Administered 2023-02-01: 7.5 mL via INTRAVENOUS

## 2023-02-05 ENCOUNTER — Ambulatory Visit: Payer: Commercial Managed Care - HMO | Admitting: Pharmacist

## 2023-02-22 ENCOUNTER — Encounter: Payer: Self-pay | Admitting: Internal Medicine

## 2023-02-22 ENCOUNTER — Ambulatory Visit (INDEPENDENT_AMBULATORY_CARE_PROVIDER_SITE_OTHER): Payer: Commercial Managed Care - HMO | Admitting: Internal Medicine

## 2023-02-22 ENCOUNTER — Other Ambulatory Visit: Payer: Self-pay

## 2023-02-22 VITALS — BP 128/89 | HR 101 | Resp 16 | Ht 68.0 in | Wt 163.0 lb

## 2023-02-22 DIAGNOSIS — A6923 Arthritis due to Lyme disease: Secondary | ICD-10-CM | POA: Diagnosis not present

## 2023-02-22 MED ORDER — DOXYCYCLINE HYCLATE 100 MG PO TABS: 100.0000 mg | ORAL_TABLET | Freq: Two times a day (BID) | ORAL | 0 refills | Status: AC

## 2023-02-22 NOTE — Patient Instructions (Signed)
Continue doxycycline 100 mg twice a day for 30 days   Avoid sun light or wear sunscreen while taking doxy   I have referred you to IR (radiology) to aspirate the knee and send for lyme testing   Take ibuprofen 600 mg every 6-8 hours as needed for pain/swelling Rest/ice as needed   If ortho is cheaper, please see them for knee aspirate   See [place] in 4 weeks

## 2023-02-22 NOTE — Progress Notes (Signed)
Subjective:  Reason for infectious disease consult: Prosthetic joint infection  Requesting physician Edmonia Lynch, MD   Patient ID: Brady Rivera, male    DOB: [birth year], 37 y.o.   MRN: [id number]  HPI   Brady Rivera is a 37 year old black man with past medical history significant for seasonal allergies who is on preexposure prophylaxis for HIV via the health department at [facility] hex with DESCOVY.  He developed acute swelling and pain of his right knee this past October.  The joint was aspirated and had high white count with PMN predominance.  Cultures from Dr. Debroah Loop office were negative.  He was taken to the operating room and underwent scopic I&D on September 30, 2022.  New cultures in the operating room were not taken at that time.  He was given a shot of ceftriaxone intramuscularly and prescribed cephalexin which she took for 4 to 6 weeks although there may not have been perfect adherence.  He is still had persistent pain and undergone aspiration of the joint.  This was done Dr. Debroah Loop office on 10 January aspiration revealed 16,365 blood cells with 62% neutrophils and 35% lymphocytes.  No crystals were seen and no organism grew on culture.  He had a sed rate drawn in their office as well which was normal at 14 and a CRP that was normal at 7.9 CBC with differential was normal as well.  BMP was not done.  I am wondering if it is possible that [place] had disseminated gonococcal infection given his risk for gonorrhea/with having sex with other men.  He is regularly tested by [facility] with both genital and extragenital gonorrhea and chlamydia amplification.  That being said it is certainly possible he could have had a disseminated gonococcal infection at the time he had a septic knee and that this was partially treated by 1 g or 2 g of ceftriaxone IM given in the orthopedic surgery office.  Also possibly could have a Staph epidermidis or streptococcal infection that is  partially treated though I think that is not as likely.  Finally Lyme arthritis would be possible though he has no history of erythema migrans and no recollection of recent tick bites.  --------------- 01/30/23 id f/u He is tolerating Ceftriaxone and doxycycline. 6 weeks eot planned for 3/10.  I reviewed lyme exposure 2-3 months before onset of knee pain (noticed tick on his right arm). Exposure happened in [place]. No other prior exposure. 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 He did visited [place] this past year several months before onset of arthritis, in the spring 2023; was there for 2 weeks  Initial dx septic arthritis 09/30/22 -- operative cx or aspirate cx negative.  He noticed a rash after on his arm at the tick bite pink which self resolved. No numbness/tingling/palpitation at that time.  Lyme serology igm and igg positive 12/29/22. At least 5-6 months after initial exposure, which for the igm it is rather unusual. But the onset of arthritis does correlate. Also rare for primary lyme to occur in [place]   Today he reports has 2 weeks worsening swelling of the right knee. No other joint pain. Knee pain moderate to severe and corresponds with the swelling level No fever/chill. When he is on his legs more then he would notice worsening pain/swelling  No n/v/diarrhea  Reviewed opat lab 2/15 and 2/6 crp normal; esr  stable at 45  02/22/23 id clinic f/u Patient still with same swelling and severe pain on weight bearing and bending more than 90 degree right knee No f/c No n/v/diarrhea Still on on doxy; ceftriaxone finished 5 days prior to this visit  He has mri knee recently and f/u ortho for aspiration of the knee but due to high co pay 160 dollars he couldn't be seen as he didn't have enough money   Past Medical History:  Diagnosis Date    Allergies    causes asthma   Asthma    COVID-19    2022   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   On pre-exposure prophylaxis for HIV 12/29/2022   Screening for HIV (human immunodeficiency virus) 12/29/2022    Past Surgical History:  Procedure Laterality Date   KNEE ARTHROSCOPY Right 09/30/2022   Procedure: ARTHROSCOPIC IRRIGATION AND DEBRIDEMENT RIGHT KNEE;  Surgeon: Renette Butters, MD;  Location: [facility];  Service: Orthopedics;  Laterality: Right;    Family History  Problem Relation Age of Onset   Diabetes Mother    High blood pressure Mother    Diabetes Father    High blood pressure Father       Social History   Socioeconomic History   Marital status: Single    Spouse name: Not on file   Number of children: Not on file   Years of education: Not on file   Highest education level: Not on file  Occupational History   Not on file  Tobacco Use   Smoking status: Never    Passive exposure: Never   Smokeless tobacco: Never  Vaping Use   Vaping Use: Never used  Substance and Sexual Activity   Alcohol use: Yes    Comment: socially   Drug use: No   Sexual activity: Not on file  Other Topics Concern   Not on file  Social History Narrative   Not on file   Social Determinants of Health   Financial Resource Strain: Not on file  Food Insecurity: Not on file  Transportation Needs: Not on file  Physical Activity: Not on file  Stress: Not on file  Social Connections: Not on file    No Known Allergies   Current Outpatient Medications:    albuterol (VENTOLIN HFA) 108 (90 Base) MCG/ACT inhaler, Inhale 2 puffs into the lungs every 4 (four) hours as needed for wheezing or shortness of breath., Disp: 1 each, Rfl: 0   cefTRIAXone 2 g in dextrose 5 % 50 mL, Inject 2 g into the vein daily., Disp: 42 Bag, Rfl: 0   DESCOVY 200-25 MG tablet, Take 1 tablet by mouth daily., Disp: , Rfl:    diphenhydrAMINE (BENADRYL) 25 MG tablet,  Take 50 mg by mouth every 6 (six) hours as needed for allergies., Disp: , Rfl:    doxycycline (VIBRA-TABS) 100 MG tablet, Take 1 tablet (100 mg total) by mouth 2 (two) times daily., Disp: 84 tablet, Rfl: 1   fluticasone (FLONASE) 50 MCG/ACT nasal spray, Place 2 sprays into both nostrils daily as needed for allergies or rhinitis. (Patient not taking: Reported on 01/30/2023), Disp: , Rfl:    Menthol, Topical Analgesic, (ICY HOT EX), Apply 1 Application topically daily as needed. (Patient not taking: Reported on 01/30/2023), Disp: , Rfl:    Menthol-Methyl Salicylate (MUSCLE RUB) 10-15 % CREA, Apply 1 Application topically as needed for muscle pain. (Patient not taking: Reported on 01/30/2023), Disp: , Rfl:  Review of Systems  Constitutional:  Negative for activity change, appetite change, chills, diaphoresis, fatigue, fever and unexpected weight change.  HENT:  Negative for congestion, rhinorrhea, sinus pressure, sneezing, sore throat and trouble swallowing.   Eyes:  Negative for photophobia and visual disturbance.  Respiratory:  Negative for cough, chest tightness, shortness of breath, wheezing and stridor.   Cardiovascular:  Negative for chest pain, palpitations and leg swelling.  Gastrointestinal:  Negative for abdominal distention, abdominal pain, anal bleeding, blood in stool, constipation, diarrhea, nausea and vomiting.  Genitourinary:  Negative for difficulty urinating, dysuria, flank pain and hematuria.  Musculoskeletal:  Positive for joint swelling. Negative for arthralgias, back pain, gait problem and myalgias.  Skin:  Negative for color change, pallor, rash and wound.  Neurological:  Negative for dizziness, tremors, weakness and light-headedness.  Hematological:  Negative for adenopathy. Does not bruise/bleed easily.  Psychiatric/Behavioral:  Negative for agitation, behavioral problems, confusion, decreased concentration, dysphoric mood and sleep disturbance.        Objective:    Physical Exam Constitutional:      Appearance: He is well-developed.  HENT:     Head: Normocephalic and atraumatic.  Eyes:     Conjunctiva/sclera: Conjunctivae normal.  Cardiovascular:     Rate and Rhythm: Normal rate and regular rhythm.  Pulmonary:     Effort: Pulmonary effort is normal. No respiratory distress.     Breath sounds: No wheezing.  Abdominal:     General: There is no distension.     Palpations: Abdomen is soft.  Musculoskeletal:        General: Normal range of motion.     Cervical back: Normal range of motion and neck supple.     Right knee: Swelling present. Tenderness present.  Skin:    General: Skin is warm and dry.     Coloration: Skin is not pale.     Findings: No erythema or rash.  Neurological:     General: No focal deficit present.     Mental Status: He is alert and oriented to person, place, and time.  Psychiatric:        Mood and Affect: Mood normal.        Behavior: Behavior normal.        Thought Content: Thought content normal.        Judgment: Judgment normal.     02/22/23 picture (warmth on right knee with swelling         Assessment & Plan:   Culture negative septic arthritis:  I wonder if this could have been DGI that was not completely trearted but which may have responded partially to Ceftriaxone IM in orthopedics  I doubt Lyme though I doubt it I will check Lyme titers.  I will check a BMP with GFR.  I also wonder about inflammatory arthritis but he should have had elevated inflammatory markers I will she will still check a rheumatoid factor and ANCA profile.  I will check an MRI of his knee to look for osteomyelitis though that would seem very unlikely as well.  I think given that still has abnormal cells in the joint and he has not been completely thoroughly treated for potential disseminated gonococcal infection and/or Staph epidermidis or streptococcal septic arthritis I think giving him a 6-week course of ceftriaxone with  doxycycline would be reasonable.  I will also have him follow-up with my partner Dr. Gale Journey.  We are putting in orders for PICC line so that he can have ceftriaxone.  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    Diagnosis: Septic arthritis with negative cultures  Culture Result: No growth   --------------- 01/30/23 id assessment Right knee acute monoarticular arthritis suggestive of septic arthritis; positive lyme serology igg/igm and also probable temporally fit exposure Discuss lyme treatment would have killed the bacterial by now; however, the arthritis response unclear if will take a little longer to improve  Swelling last 2 weeks patient to call ortho for aspiration  Given potential location not quite suggestive, and for semantics purpose, if knee is to be aspirated again, would recommend sending for lyme pcr with priority, then bacterial, fungal, and afb culture  Will send chart to dr Edmonia Lynch as well  Elizebeth Koller ceftriaxone/doxy (would keep broad) given some potential ambiguity, until 02/18/23  Follow up with either me or dr Tommy Medal 3 weeks from now   He is on prep therapy and to continue descovy as previous   02/22/23 id assessment Abx: Ceftriaxone 6 weeks finished 3/10 Doxycycline still taking   Crp remains normal about 10 days ago but sx/sign of arthritis remains severe Partial response I would call this in setting of both po and iv abx  Unfortunately the picc has been removed, so I'll continue doxy for 4 more weeks abx and refer to rheumatology for sx management Trial of nsaids now as well  Will also refer to IR for knee aspirate with request for (crystal/cell count-differential/fluid culture/ and lyme pcr)       I have spent a total of 40 minutes of face-to-face and non-face-to-face time, excluding clinical staff time, preparing to see patient, ordering tests and/or medications, and provide counseling the patient

## 2023-03-22 ENCOUNTER — Encounter: Payer: Self-pay | Admitting: Internal Medicine

## 2023-03-22 ENCOUNTER — Other Ambulatory Visit: Payer: Self-pay

## 2023-03-22 ENCOUNTER — Ambulatory Visit (INDEPENDENT_AMBULATORY_CARE_PROVIDER_SITE_OTHER): Payer: Commercial Managed Care - HMO | Admitting: Internal Medicine

## 2023-03-22 VITALS — BP 131/86 | HR 85 | Temp 98.4°F | Resp 16 | Wt 165.2 lb

## 2023-03-22 DIAGNOSIS — A6923 Arthritis due to Lyme disease: Secondary | ICD-10-CM

## 2023-03-22 NOTE — Patient Instructions (Signed)
Finish doxycycline end of this month   Labs today   See me 2nd week of may (5/8-10)

## 2023-03-22 NOTE — Progress Notes (Signed)
Subjective:  Reason for infectious disease consult: Prosthetic joint infection  Requesting physician Edmonia Lynch, MD   Patient ID: Brady Rivera, male    DOB: [birth year], 37 y.o.   MRN: [id number]  HPI   Brady Rivera is a 37 year old black man with past medical history significant for seasonal allergies who is on preexposure prophylaxis for HIV via the health department at [facility] hex with DESCOVY.  He developed acute swelling and pain of his right knee this past October.  The joint was aspirated and had high white count with PMN predominance.  Cultures from Dr. Debroah Loop office were negative.  He was taken to the operating room and underwent scopic I&D on September 30, 2022.  New cultures in the operating room were not taken at that time.  He was given a shot of ceftriaxone intramuscularly and prescribed cephalexin which she took for 4 to 6 weeks although there may not have been perfect adherence.  He is still had persistent pain and undergone aspiration of the joint.  This was done Dr. Debroah Loop office on 10 January aspiration revealed 16,365 blood cells with 62% neutrophils and 35% lymphocytes.  No crystals were seen and no organism grew on culture.  He had a sed rate drawn in their office as well which was normal at 14 and a CRP that was normal at 7.9 CBC with differential was normal as well.  BMP was not done.  I am wondering if it is possible that Brady Rivera had disseminated gonococcal infection given his risk for gonorrhea/with having sex with other men.  He is regularly tested by [facility] with both genital and extragenital gonorrhea and chlamydia amplification.  That being said it is certainly possible he could have had a disseminated gonococcal infection at the time he had a septic knee and that this was partially treated by 1 g or 2 g of ceftriaxone IM given in the orthopedic surgery office.  Also possibly could have a Staph epidermidis or streptococcal infection that is  partially treated though I think that is not as likely.  Finally Lyme arthritis would be possible though he has no history of erythema migrans and no recollection of recent tick bites.  --------------- 01/30/23 id f/u He is tolerating Ceftriaxone and doxycycline. 6 weeks eot planned for 3/10.  I reviewed lyme exposure 2-3 months before onset of knee pain (noticed tick on his right arm). Exposure happened in [place]. No other prior exposure. 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 He did visited [place] this past year several months before onset of arthritis, in the spring 2023; was there for 2 weeks  Initial dx septic arthritis 09/30/22 -- operative cx or aspirate cx negative.  He noticed a rash after on his arm at the tick bite pink which self resolved. No numbness/tingling/palpitation at that time.  Lyme serology igm and igg positive 12/29/22. At least 5-6 months after initial exposure, which for the igm it is rather unusual. But the onset of arthritis does correlate. Also rare for primary lyme to occur in [place]   Today he reports has 2 weeks worsening swelling of the right knee. No other joint pain. Knee pain moderate to severe and corresponds with the swelling level No fever/chill. When he is on his legs more then he would notice worsening pain/swelling  No n/v/diarrhea  Reviewed opat lab 2/15 and 2/6 crp normal; esr  stable at 45  02/22/23 id clinic f/u Patient still with same swelling and severe pain on weight bearing and bending more than 90 degree right knee No f/c No n/v/diarrhea Still on on doxy; ceftriaxone finished 5 days prior to this visit  He has mri knee recently and f/u ortho for aspiration of the knee but due to high co pay 160 dollars he couldn't be seen as he didn't have enough money   03/22/23 id f/u Right knee pain is  moderately better 5/10 now vs 10/10 a month ago No further knee swelling Still on doxycycline Left knee slight pain and effusion ?walking funning to favor right knee vs involvement.     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   On pre-exposure prophylaxis for HIV 12/29/2022   Screening for HIV (human immunodeficiency virus) 12/29/2022    Past Surgical History:  Procedure Laterality Date   KNEE ARTHROSCOPY Right 09/30/2022   Procedure: ARTHROSCOPIC IRRIGATION AND DEBRIDEMENT RIGHT KNEE;  Surgeon: Sheral Apley, MD;  Location: [facility] OR;  Service: Orthopedics;  Laterality: Right;    Family History  Problem Relation Age of Onset   Diabetes Mother    High blood pressure Mother    Diabetes Father    High blood pressure Father       Social History   Socioeconomic History   Marital status: Single    Spouse name: Not on file   Number of children: Not on file   Years of education: Not on file   Highest education level: Not on file  Occupational History   Not on file  Tobacco Use   Smoking status: Never    Passive exposure: Never   Smokeless tobacco: Never  Vaping Use   Vaping Use: Never used  Substance and Sexual Activity   Alcohol use: Yes    Comment: socially   Drug use: No   Sexual activity: Not on file  Other Topics Concern   Not on file  Social History Narrative   Not on file   Social Determinants of Health   Financial Resource Strain: Not on file  Food Insecurity: Not on file  Transportation Needs: Not on file  Physical Activity: Not on file  Stress: Not on file  Social Connections: Not on file    No Known Allergies   Current Outpatient Medications:    albuterol (VENTOLIN HFA) 108 (90 Base) MCG/ACT inhaler, Inhale 2 puffs into the lungs every 4 (four) hours as needed for wheezing or shortness of breath., Disp: 1 each,  Rfl: 0   DESCOVY 200-25 MG tablet, Take 1 tablet by mouth daily., Disp: , Rfl:    diphenhydrAMINE (BENADRYL) 25 MG tablet, Take 50 mg by mouth every 6 (six) hours as needed for allergies., Disp: , Rfl:    doxycycline (VIBRA-TABS) 100 MG tablet, Take 1 tablet (100 mg total) by mouth 2 (two) times daily., Disp: 60 tablet, Rfl: 0   cefTRIAXone 2 g in dextrose 5 % 50 mL, Inject 2 g into the vein daily. (Patient not taking: Reported on 02/22/2023), Disp: 42 Bag, Rfl: 0   fluticasone (FLONASE) 50 MCG/ACT nasal spray, Place 2 sprays into both nostrils daily as needed for allergies or rhinitis. (Patient not taking: Reported on 01/30/2023), Disp: , Rfl:    Menthol, Topical Analgesic, (ICY HOT EX), Apply  1 Application topically daily as needed. (Patient not taking: Reported on 01/30/2023), Disp: , Rfl:    Menthol-Methyl Salicylate (MUSCLE RUB) 10-15 % CREA, Apply 1 Application topically as needed for muscle pain. (Patient not taking: Reported on 01/30/2023), Disp: , Rfl:    Review of Systems  Constitutional:  Negative for activity change, appetite change, chills, diaphoresis, fatigue, fever and unexpected weight change.  HENT:  Negative for congestion, rhinorrhea, sinus pressure, sneezing, sore throat and trouble swallowing.   Eyes:  Negative for photophobia and visual disturbance.  Respiratory:  Negative for cough, chest tightness, shortness of breath, wheezing and stridor.   Cardiovascular:  Negative for chest pain, palpitations and leg swelling.  Gastrointestinal:  Negative for abdominal distention, abdominal pain, anal bleeding, blood in stool, constipation, diarrhea, nausea and vomiting.  Genitourinary:  Negative for difficulty urinating, dysuria, flank pain and hematuria.  Musculoskeletal:  Positive for joint swelling. Negative for arthralgias, back pain, gait problem and myalgias.  Skin:  Negative for color change, pallor, rash and wound.  Neurological:  Negative for dizziness, tremors, weakness and  light-headedness.  Hematological:  Negative for adenopathy. Does not bruise/bleed easily.  Psychiatric/Behavioral:  Negative for agitation, behavioral problems, confusion, decreased concentration, dysphoric mood and sleep disturbance.        Objective:   Physical Exam Constitutional:      Appearance: He is well-developed.  HENT:     Head: Normocephalic and atraumatic.  Eyes:     Conjunctiva/sclera: Conjunctivae normal.  Cardiovascular:     Rate and Rhythm: Normal rate and regular rhythm.  Pulmonary:     Effort: Pulmonary effort is normal. No respiratory distress.     Breath sounds: No wheezing.  Abdominal:     General: There is no distension.     Palpations: Abdomen is soft.  Musculoskeletal:        General: Normal range of motion.     Cervical back: Normal range of motion and neck supple.     Right knee: Swelling present. Tenderness present.  Skin:    General: Skin is warm and dry.     Coloration: Skin is not pale.     Findings: No erythema or rash.  Neurological:     General: No focal deficit present.     Mental Status: He is alert and oriented to person, place, and time.  Psychiatric:        Mood and Affect: Mood normal.        Behavior: Behavior normal.        Thought Content: Thought content normal.        Judgment: Judgment normal.    03/22/23 Right knee cool to touch no swelling/tenderness; left knee cool but slight effusion  Full rom bilateral knee        Assessment & Plan:   Culture negative septic arthritis:  I wonder if this could have been DGI that was not completely trearted but which may have responded partially to Ceftriaxone IM in orthopedics  I doubt Lyme though I doubt it I will check Lyme titers.  I will check a BMP with GFR.  I also wonder about inflammatory arthritis but he should have had elevated inflammatory markers I will she will still check a rheumatoid factor and ANCA profile.  I will check an MRI of his knee to look for  osteomyelitis though that would seem very unlikely as well.  I think given that still has abnormal cells in the joint and he has not been completely thoroughly treated  for potential disseminated gonococcal infection and/or Staph epidermidis or streptococcal septic arthritis I think giving him a 6-week course of ceftriaxone with doxycycline would be reasonable.  I will also have him follow-up with my partner Dr. Renold DonVu.  We are putting in orders for PICC line so that he can have ceftriaxone.  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    Diagnosis: Septic arthritis with negative cultures  Culture Result: No growth   --------------- 01/30/23 id assessment Right knee acute monoarticular arthritis suggestive of septic arthritis; positive lyme serology igg/igm and also probable temporally fit exposure Discuss lyme treatment would have killed the bacterial by now; however, the arthritis response unclear if will take a little longer to improve  Swelling last 2 weeks patient to call ortho for aspiration  Given potential location not quite suggestive, and for semantics purpose, if knee is to be aspirated again, would recommend sending for lyme pcr with priority, then bacterial, fungal, and afb culture  Will send chart to dr Margarita Ranaimothy Murphy as well  Doreatha MartinFinish ceftriaxone/doxy (would keep broad) given some potential ambiguity, until 02/18/23  Follow up with either me or dr Daiva EvesVan Dam 3 weeks from now   He is on prep therapy and to continue descovy as previous   02/22/23 id assessment Abx: Ceftriaxone 6 weeks finished 3/10 Doxycycline still taking   Crp remains normal about 10 days ago but sx/sign of arthritis remains severe Partial response I would call this in setting of both po and iv abx  Unfortunately the picc has been removed, so I'll continue doxy for 4 more weeks abx and refer to rheumatology for sx management Trial of nsaids now as well  Will also refer to IR for  knee aspirate with request for (crystal/cell count-differential/fluid culture/ and lyme pcr)     03/22/23 id assessment Patient's right knee is a lot better today The left knee does have slight effusion on exam. Not sure if postural due to right knee pain and new posture or related to infection (rather atypical as he is on abx) Will keep the doxycycline going until end of this month for total 10 weeks approximately  F/u first or 2nd week of may  Labs today  Continue nsaids prn  I have spent a total of 30 minutes of face-to-face and non-face-to-face time, excluding clinical staff time, preparing to see patient, ordering tests and/or medications, and provide counseling the patient

## 2023-03-23 LAB — CBC
HCT: 40 % (ref 38.5–50.0)
Hemoglobin: 13.5 g/dL (ref 13.2–17.1)
MCH: 31.8 pg (ref 27.0–33.0)
MCHC: 33.8 g/dL (ref 32.0–36.0)
MCV: 94.3 fL (ref 80.0–100.0)
MPV: 9.6 fL (ref 7.5–12.5)
Platelets: 331 10*3/uL (ref 140–400)
RBC: 4.24 10*6/uL (ref 4.20–5.80)
RDW: 12.6 % (ref 11.0–15.0)
WBC: 5.5 10*3/uL (ref 3.8–10.8)

## 2023-03-23 LAB — COMPLETE METABOLIC PANEL WITH GFR
AG Ratio: 1.3 (calc) (ref 1.0–2.5)
ALT: 20 U/L (ref 9–46)
AST: 22 U/L (ref 10–40)
Albumin: 4 g/dL (ref 3.6–5.1)
Alkaline phosphatase (APISO): 64 U/L (ref 36–130)
BUN: 19 mg/dL (ref 7–25)
CO2: 30 mmol/L (ref 20–32)
Calcium: 8.5 mg/dL — ABNORMAL LOW (ref 8.6–10.3)
Chloride: 101 mmol/L (ref 98–110)
Creat: 0.92 mg/dL (ref 0.60–1.26)
Globulin: 3 g/dL (calc) (ref 1.9–3.7)
Glucose, Bld: 101 mg/dL — ABNORMAL HIGH (ref 65–99)
Potassium: 4.3 mmol/L (ref 3.5–5.3)
Sodium: 140 mmol/L (ref 135–146)
Total Bilirubin: 0.5 mg/dL (ref 0.2–1.2)
Total Protein: 7 g/dL (ref 6.1–8.1)
eGFR: 111 mL/min/{1.73_m2} (ref >=60)

## 2023-03-23 LAB — C-REACTIVE PROTEIN: CRP: 1.6 mg/L (ref <8.0)

## 2023-04-26 ENCOUNTER — Ambulatory Visit: Payer: Medicaid Other | Admitting: Internal Medicine

## 2023-04-26 ENCOUNTER — Other Ambulatory Visit: Payer: Self-pay

## 2023-04-26 ENCOUNTER — Encounter: Payer: Self-pay | Admitting: Internal Medicine

## 2023-04-26 VITALS — BP 120/82 | HR 82 | Resp 16 | Ht 68.0 in | Wt 162.0 lb

## 2023-04-26 DIAGNOSIS — A6923 Arthritis due to Lyme disease: Secondary | ICD-10-CM | POA: Diagnosis present

## 2023-04-26 NOTE — Progress Notes (Signed)
Subjective:  Reason for infectious disease consult: Prosthetic joint infection  Requesting physician Margarita Rana, MD   Patient ID: [name], male    DOB: 1986-06-14, 37 y.o.   MRN: [id number]  HPI: Brady Rivera is a 37 year old black man with past medical history significant for seasonal allergies who is on preexposure prophylaxis for HIV via the health department at [facility] with DESCOVY.  He developed acute swelling and pain of his right knee this past October.  The joint was aspirated and had high white count with PMN predominance.  Cultures from Dr. Greig Right office were negative.  He was taken to the operating room and underwent scopic I&D on September 30, 2022.  New cultures in the operating room were not taken at that time.  He was given a shot of ceftriaxone intramuscularly and prescribed cephalexin which she took for 4 to 6 weeks although there may not have been perfect adherence.  He is still had persistent pain and undergone aspiration of the joint.  This was done Dr. Greig Right office on 10 January aspiration revealed 16,365 blood cells with 62% neutrophils and 35% lymphocytes.  No crystals were seen and no organism grew on culture.  He had a sed rate drawn in their office as well which was normal at 14 and a CRP that was normal at 7.9 CBC with differential was normal as well.  BMP was not done.  I am wondering if it is possible that [name] had disseminated gonococcal infection given his risk for gonorrhea/with having sex with other men.  He is regularly tested by [facility] with both genital and extragenital gonorrhea and chlamydia amplification.  That being said it is certainly possible he could have had a disseminated gonococcal infection at the time he had a septic knee and that this was partially treated by 1 g or 2 g of ceftriaxone IM given in the orthopedic surgery office.  Also possibly could have a Staph epidermidis or streptococcal infection that is partially  treated though I think that is not as likely.  Finally Lyme arthritis would be possible though he has no history of erythema migrans and no recollection of recent tick bites.  --------------- 01/30/23 id f/u He is tolerating Ceftriaxone and doxycycline. 6 weeks eot planned for 3/10.  I reviewed lyme exposure 2-3 months before onset of knee pain (noticed tick on his right arm). Exposure happened in [place]. No other prior exposure. Never traveled [place] of [place]  He is a Investment banker, operational. Denies outdoor activities. [place] in [place], not [place] place. He did visited [place] this past year several months before onset of arthritis, in the spring 2023; was there for 2 weeks  Initial dx septic arthritis 09/30/22 -- operative cx or aspirate cx negative.  He noticed a rash after on his arm at the tick bite pink which self resolved. No numbness/tingling/palpitation at that time.  Lyme serology igm and igg positive 12/29/22. At least 5-6 months after initial exposure, which for the igm it is rather unusual. But the onset of arthritis does correlate. Also rare for primary lyme to occur in [place]   Today he reports has 2 weeks worsening swelling of the right knee. No other joint pain. Knee pain moderate to severe and corresponds with the swelling level No fever/chill. When he is on his legs more then he would notice worsening pain/swelling  No n/v/diarrhea  Reviewed opat lab 2/15 and 2/6 crp normal; esr stable at  45  02/22/23 id clinic f/u Patient still with same swelling and severe pain on weight bearing and bending more than 90 degree right knee No f/c No n/v/diarrhea Still on on doxy; ceftriaxone finished 5 days prior to this visit  He has mri knee recently and f/u ortho for aspiration of the knee but due to high co pay 160 dollars he couldn't be seen as he didn't have enough money   03/22/23 id f/u Right knee pain is moderately  better 5/10 now vs 10/10 a month ago No further knee swelling Still on doxycycline Left knee slight pain and effusion ?walking funning to favor right knee vs involvement.    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   On pre-exposure prophylaxis for HIV 12/29/2022   Screening for HIV (human immunodeficiency virus) 12/29/2022    Past Surgical History:  Procedure Laterality Date   KNEE ARTHROSCOPY Right 09/30/2022   Procedure: ARTHROSCOPIC IRRIGATION AND DEBRIDEMENT RIGHT KNEE;  Surgeon: Sheral Apley, MD;  Location: [facility] OR;  Service: Orthopedics;  Laterality: Right;    Family History  Problem Relation Age of Onset   Diabetes Mother    High blood pressure Mother    Diabetes Father    High blood pressure Father       Social History   Socioeconomic History   Marital status: Single    Spouse name: Not on file   Number of children: Not on file   Years of education: Not on file   Highest education level: Not on file  Occupational History   Not on file  Tobacco Use   Smoking status: Never    Passive exposure: Never   Smokeless tobacco: Never  Vaping Use   Vaping Use: Never used  Substance and Sexual Activity   Alcohol use: Yes    Comment: socially   Drug use: No   Sexual activity: Not on file  Other Topics Concern   Not on file  Social History Narrative   Not on file   Social Determinants of Health   Financial Resource Strain: Not on file  Food Insecurity: Not on file  Transportation Needs: Not on file  Physical Activity: Not on file  Stress: Not on file  Social Connections: Not on file    No Known Allergies   Current Outpatient Medications:     albuterol (VENTOLIN HFA) 108 (90 Base) MCG/ACT inhaler, Inhale 2 puffs into the lungs every 4 (four) hours as needed for wheezing or shortness of breath., Disp: 1 each, Rfl: 0   DESCOVY 200-25 MG tablet, Take 1 tablet by mouth daily., Disp: , Rfl:    doxycycline (VIBRA-TABS) 100 MG tablet, Take 100 mg by mouth 2 (two) times daily., Disp: , Rfl:    emtricitabine-tenofovir (TRUVADA) 200-300 MG tablet, Take 1 tablet by mouth daily., Disp: , Rfl:    cefTRIAXone 2 g in dextrose 5 % 50 mL, Inject 2 g into the vein daily. (Patient not taking: Reported on 02/22/2023), Disp: 42 Bag, Rfl: 0   diphenhydrAMINE (BENADRYL) 25 MG tablet, Take 50 mg by mouth every 6 (six) hours as needed for  allergies. (Patient not taking: Reported on 04/26/2023), Disp: , Rfl:    fluticasone (FLONASE) 50 MCG/ACT nasal spray, Place 2 sprays into both nostrils daily as needed for allergies or rhinitis. (Patient not taking: Reported on 01/30/2023), Disp: , Rfl:    Menthol, Topical Analgesic, (ICY HOT EX), Apply 1 Application topically daily as needed. (Patient not taking: Reported on 01/30/2023), Disp: , Rfl:    Menthol-Methyl Salicylate (MUSCLE RUB) 10-15 % CREA, Apply 1 Application topically as needed for muscle pain. (Patient not taking: Reported on 01/30/2023), Disp: , Rfl:    Review of Systems  Constitutional:  Negative for activity change, appetite change, chills, diaphoresis, fatigue, fever and unexpected weight change.  HENT:  Negative for congestion, rhinorrhea, sinus pressure, sneezing, sore throat and trouble swallowing.   Eyes:  Negative for photophobia and visual disturbance.  Respiratory:  Negative for cough, chest tightness, shortness of breath, wheezing and stridor.   Cardiovascular:  Negative for chest pain, palpitations and leg swelling.  Gastrointestinal:  Negative for abdominal distention, abdominal pain, anal bleeding, blood in stool, constipation, diarrhea, nausea and vomiting.  Genitourinary:  Negative for  difficulty urinating, dysuria, flank pain and hematuria.  Musculoskeletal:  Positive for joint swelling. Negative for arthralgias, back pain, gait problem and myalgias.  Skin:  Negative for color change, pallor, rash and wound.  Neurological:  Negative for dizziness, tremors, weakness and light-headedness.  Hematological:  Negative for adenopathy. Does not bruise/bleed easily.  Psychiatric/Behavioral:  Negative for agitation, behavioral problems, confusion, decreased concentration, dysphoric mood and sleep disturbance.        Objective:     Physical exam: Vitals:   04/26/23 1553  BP: 120/82  Pulse: 82  Resp: 16  SpO2: 98%    03/22/23 Right knee cool to touch no swelling/tenderness; left knee cool but slight effusion  Full rom bilateral knee  04/26/2023 Right knee mild swelling but no tenderness; full active rom General: no distress pulM: normal respiratory effort Skin: no rash      Assessment & Plan:   Culture negative septic arthritis:  I wonder if this could have been DGI that was not completely trearted but which may have responded partially to Ceftriaxone IM in orthopedics  I doubt Lyme though I doubt it I will check Lyme titers.  I will check a BMP with GFR.  I also wonder about inflammatory arthritis but he should have had elevated inflammatory markers I will she will still check a rheumatoid factor and ANCA profile.  I will check an MRI of his knee to look for osteomyelitis though that would seem very unlikely as well.  I think given that still has abnormal cells in the joint and he has not been completely thoroughly treated for potential disseminated gonococcal infection and/or Staph epidermidis or streptococcal septic arthritis I think giving him a 6-week course of ceftriaxone with doxycycline would be reasonable.  I will also have him follow-up with my partner Dr. Renold Don.  We are putting in orders for PICC line so that he can have ceftriaxone.  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    Diagnosis: Septic arthritis with negative cultures  Culture Result: No growth   --------------- 01/30/23 id assessment Right knee acute monoarticular arthritis suggestive of septic arthritis; positive lyme serology igg/igm and also probable temporally fit exposure Discuss lyme treatment would have killed the bacterial by now; however, the arthritis response unclear if will take a little longer to improve  Swelling  last 2 weeks patient to call ortho for aspiration  Given potential location not quite suggestive, and for semantics purpose, if knee is to be aspirated again, would recommend sending for lyme pcr with priority, then bacterial, fungal, and afb culture  Will send chart to dr Margarita Rana as well  Doreatha Martin ceftriaxone/doxy (would keep broad) given some potential ambiguity, until 02/18/23  Follow up with either me or dr Daiva Eves 3 weeks from now   He is on prep therapy and to continue descovy as previous   02/22/23 id assessment Abx: Ceftriaxone 6 weeks finished 3/10 Doxycycline still taking   Crp remains normal about 10 days ago but sx/sign of arthritis remains severe Partial response I would call this in setting of both po and iv abx  Unfortunately the picc has been removed, so I'll continue doxy for 4 more weeks abx and refer to rheumatology for sx management Trial of nsaids now as well  Will also refer to IR for knee aspirate with request for (crystal/cell count-differential/fluid culture/ and lyme pcr)     03/22/23 id assessment Patient's right knee is a lot better today The left knee does have slight effusion on exam. Not sure if postural due to right knee pain and new posture or related to infection (rather atypical as he is on abx) Will keep the doxycycline going until end of this month for total 10 weeks approximately  F/u first or 2nd week of may  Labs today  Continue nsaids prn   04/26/23 id  assessment Stable on abx; mild sx of arthralgia I suspect he might have both actual septic arthritis and pseudoseptic arthritis from lyme  Given stability of mild sx on abx now almost 2.5 months, I do no believe further abx will benefit him  I anticipate here and there "flares" of arthralgia/effusion which would be self limited   Continue sx management and consider long term if sx persist rheumatologic help as well  No need for further ID f/u From my standpoint can stop abx today

## 2023-04-26 NOTE — Patient Instructions (Signed)
You can stop abx today   Take motrin as needed for pain / swelling   If pain persist, consider rheumatology evaluation for help with symptoms management

## 2023-05-03 ENCOUNTER — Encounter: Payer: Self-pay | Admitting: Internal Medicine

## 2023-05-03 ENCOUNTER — Ambulatory Visit: Payer: Medicaid Other | Attending: Internal Medicine | Admitting: Internal Medicine

## 2023-05-03 VITALS — BP 122/83 | HR 83 | Temp 98.6°F | Ht 68.0 in | Wt 164.0 lb

## 2023-05-03 DIAGNOSIS — J4520 Mild intermittent asthma, uncomplicated: Secondary | ICD-10-CM

## 2023-05-03 DIAGNOSIS — Z79899 Other long term (current) drug therapy: Secondary | ICD-10-CM

## 2023-05-03 DIAGNOSIS — R030 Elevated blood-pressure reading, without diagnosis of hypertension: Secondary | ICD-10-CM | POA: Diagnosis not present

## 2023-05-03 DIAGNOSIS — A6923 Arthritis due to Lyme disease: Secondary | ICD-10-CM

## 2023-05-03 MED ORDER — ALBUTEROL SULFATE HFA 108 (90 BASE) MCG/ACT IN AERS: 2.0000 | INHALATION_SPRAY | RESPIRATORY_TRACT | 6 refills | Status: AC | PRN

## 2023-05-03 NOTE — Patient Instructions (Signed)
Refill sent on albuterol to your pharmacy.  Please check your blood pressure at least once a week with normal reading being 120/80 or lower.  Continue to limit the salt in the foods.  DASH Eating Plan DASH stands for Dietary Approaches to Stop Hypertension. The DASH eating plan is a healthy eating plan that has been shown to: Reduce high blood pressure (hypertension). Reduce your risk for type 2 diabetes, heart disease, and stroke. Help with weight loss. What are tips for following this plan? Reading food labels Check food labels for the amount of salt (sodium) per serving. Choose foods with less than 5 percent of the Daily Value of sodium. Generally, foods with less than 300 milligrams (mg) of sodium per serving fit into this eating plan. To find whole grains, look for the word "whole" as the first word in the ingredient list. Shopping Buy products labeled as "low-sodium" or "no salt added." Buy fresh foods. Avoid canned foods and pre-made or frozen meals. Cooking Avoid adding salt when cooking. Use salt-free seasonings or herbs instead of table salt or sea salt. Check with your health care provider or pharmacist before using salt substitutes. Do not fry foods. Cook foods using healthy methods such as baking, boiling, grilling, roasting, and broiling instead. Cook with heart-healthy oils, such as olive, canola, avocado, soybean, or sunflower oil. Meal planning  Eat a balanced diet that includes: 4 or more servings of fruits and 4 or more servings of vegetables each day. Try to fill one-half of your plate with fruits and vegetables. 6-8 servings of whole grains each day. Less than 6 oz (170 g) of lean meat, poultry, or fish each day. A 3-oz (85-g) serving of meat is about the same size as a deck of cards. One egg equals 1 oz (28 g). 2-3 servings of low-fat dairy each day. One serving is 1 cup (237 mL). 1 serving of nuts, seeds, or beans 5 times each week. 2-3 servings of heart-healthy  fats. Healthy fats called omega-3 fatty acids are found in foods such as walnuts, flaxseeds, fortified milks, and eggs. These fats are also found in cold-water fish, such as sardines, salmon, and mackerel. Limit how much you eat of: Canned or prepackaged foods. Food that is high in trans fat, such as some fried foods. Food that is high in saturated fat, such as fatty meat. Desserts and other sweets, sugary drinks, and other foods with added sugar. Full-fat dairy products. Do not salt foods before eating. Do not eat more than 4 egg yolks a week. Try to eat at least 2 vegetarian meals a week. Eat more home-cooked food and less restaurant, buffet, and fast food. Lifestyle When eating at a restaurant, ask that your food be prepared with less salt or no salt, if possible. If you drink alcohol: Limit how much you use to: 0-1 drink a day for women who are not pregnant. 0-2 drinks a day for men. Be aware of how much alcohol is in your drink. In the [place] one drink equals one 12 oz bottle of beer (355 mL), one 5 oz glass of wine (148 mL), or one 1 oz glass of hard liquor (44 mL). General information Avoid eating more than 2,300 mg of salt a day. If you have hypertension, you may need to reduce your sodium intake to 1,500 mg a day. Work with your health care provider to maintain a healthy body weight or to lose weight. Ask what an ideal weight is for you. Get at  least 30 minutes of exercise that causes your heart to beat faster (aerobic exercise) most days of the week. Activities may include walking, swimming, or biking. Work with your health care provider or dietitian to adjust your eating plan to your individual calorie needs. What foods should I eat? Fruits All fresh, dried, or frozen fruit. Canned fruit in natural juice (without added sugar). Vegetables Fresh or frozen vegetables (raw, steamed, roasted, or grilled). Low-sodium or reduced-sodium tomato and vegetable juice. Low-sodium or  reduced-sodium tomato sauce and tomato paste. Low-sodium or reduced-sodium canned vegetables. Grains Whole-grain or whole-wheat bread. Whole-grain or whole-wheat pasta. Brown rice. Orpah Cobb. Bulgur. Whole-grain and low-sodium cereals. Pita bread. Low-fat, low-sodium crackers. Whole-wheat flour tortillas. Meats and other proteins Skinless chicken or [place]. Ground chicken or [place]. Pork with fat trimmed off. Fish and seafood. Egg whites. Dried beans, peas, or lentils. Unsalted nuts, nut butters, and seeds. Unsalted canned beans. Lean cuts of beef with fat trimmed off. Low-sodium, lean precooked or cured meat, such as sausages or meat loaves. Dairy Low-fat (1%) or fat-free (skim) milk. Reduced-fat, low-fat, or fat-free cheeses. Nonfat, low-sodium ricotta or cottage cheese. Low-fat or nonfat yogurt. Low-fat, low-sodium cheese. Fats and oils Soft margarine without trans fats. Vegetable oil. Reduced-fat, low-fat, or light mayonnaise and salad dressings (reduced-sodium). Canola, safflower, olive, avocado, soybean, and sunflower oils. Avocado. Seasonings and condiments Herbs. Spices. Seasoning mixes without salt. Other foods Unsalted popcorn and pretzels. Fat-free sweets. The items listed above may not be a complete list of foods and beverages you can eat. Contact a dietitian for more information. What foods should I avoid? Fruits Canned fruit in a light or heavy syrup. Fried fruit. Fruit in cream or butter sauce. Vegetables Creamed or fried vegetables. Vegetables in a cheese sauce. Regular canned vegetables (not low-sodium or reduced-sodium). Regular canned tomato sauce and paste (not low-sodium or reduced-sodium). Regular tomato and vegetable juice (not low-sodium or reduced-sodium). Rosita Fire. Olives. Grains Baked goods made with fat, such as croissants, muffins, or some breads. Dry pasta or rice meal packs. Meats and other proteins Fatty cuts of meat. Ribs. Fried meat. Tomasa Blase. Bologna,  salami, and other precooked or cured meats, such as sausages or meat loaves. Fat from the back of a pig (fatback). Bratwurst. Salted nuts and seeds. Canned beans with added salt. Canned or smoked fish. Whole eggs or egg yolks. Chicken or [place] with skin. Dairy Whole or 2% milk, cream, and half-and-half. Whole or full-fat cream cheese. Whole-fat or sweetened yogurt. Full-fat cheese. Nondairy creamers. Whipped toppings. Processed cheese and cheese spreads. Fats and oils Butter. Stick margarine. Lard. Shortening. Ghee. Bacon fat. Tropical oils, such as coconut, palm kernel, or palm oil. Seasonings and condiments Onion salt, garlic salt, seasoned salt, table salt, and sea salt. Worcestershire sauce. Tartar sauce. Barbecue sauce. Teriyaki sauce. Soy sauce, including reduced-sodium. Steak sauce. Canned and packaged gravies. Fish sauce. Oyster sauce. Cocktail sauce. Store-bought horseradish. Ketchup. Mustard. Meat flavorings and tenderizers. Bouillon cubes. Hot sauces. Pre-made or packaged marinades. Pre-made or packaged taco seasonings. Relishes. Regular salad dressings. Other foods Salted popcorn and pretzels. The items listed above may not be a complete list of foods and beverages you should avoid. Contact a dietitian for more information. Where to find more information National Heart, Lung, and Blood Institute: PopSteam.is American Heart Association: www.heart.org Academy of Nutrition and Dietetics: www.eatright.org National Kidney Foundation: www.kidney.org Summary The DASH eating plan is a healthy eating plan that has been shown to reduce high blood pressure (hypertension). It may also reduce your  risk for type 2 diabetes, heart disease, and stroke. When on the DASH eating plan, aim to eat more fresh fruits and vegetables, whole grains, lean proteins, low-fat dairy, and heart-healthy fats. With the DASH eating plan, you should limit salt (sodium) intake to 2,300 mg a day. If you have  hypertension, you may need to reduce your sodium intake to 1,500 mg a day. Work with your health care provider or dietitian to adjust your eating plan to your individual calorie needs. This information is not intended to replace advice given to you by your health care provider. Make sure you discuss any questions you have with your health care provider. Document Revised: 10/31/2019 Document Reviewed: 10/31/2019 Elsevier Patient Education  2023 [employer].

## 2023-05-03 NOTE — Progress Notes (Signed)
Patient ID: Brady Rivera, male    DOB: 13-Apr-1986  MRN: [id number]  CC: Follow-up (Follow up. Med refill - lost inhaler/No questions / concerns)   Subjective: Brady Rivera is a 37 y.o. male who presents for chronic ds management His concerns today include:  Patient with history of right knee nonseptic arthritis (Lyme), GERD, lactose intolerance, on PreP prophylaxis for HIV  Patient last seen in January of this year.  Blood pressure was elevated at that time of 133/94.  DASH diet discussed and encouraged.  Reports he has done pretty good with limiting salt, eating more baked foods.  BP last wk on visit with ID 120/82  Right knee infection: He continues to be followed by orthopedics and ID.  Looks like his Lyme titer was positive.  He has completed IV Rocephin.  Saw ID recently.  No longer on abx. -swelling in knee has decreased over time.    Request RF on Albuterol inhaler.  Reports being dx with asthma several yrs ago.  Does not have to use it every day; more so during allergy season.    On Truvada through [facility] for HIV prophylaxis.  Last HIV test done through them 1 mth ago.  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 1 each 0   cefTRIAXone 2 g in dextrose 5 % 50 mL Inject 2 g into the vein daily. 42 Bag 0   DESCOVY 200-25 MG tablet Take 1 tablet by mouth daily.     diphenhydrAMINE (BENADRYL) 25 MG tablet Take 50 mg by mouth every 6 (six) hours as needed for allergies.     doxycycline (VIBRA-TABS) 100 MG tablet Take 100 mg by mouth 2  (two) times daily.     emtricitabine-tenofovir (TRUVADA) 200-300 MG tablet Take 1 tablet by mouth daily.     fluticasone (FLONASE) 50 MCG/ACT nasal spray Place 2 sprays into both nostrils daily as needed for allergies or rhinitis.     Menthol, Topical Analgesic, (ICY HOT EX) Apply 1 Application topically daily as needed.     Menthol-Methyl Salicylate (MUSCLE RUB) 10-15 % CREA Apply 1 Application topically as needed for muscle pain.     No current facility-administered medications on file prior to visit.    No Known Allergies  Social History   Socioeconomic History   Marital status: Single    Spouse name: Not on file   Number of children: Not on file   Years of education: Not on file   Highest education level: Not on file  Occupational History   Not on file  Tobacco Use   Smoking status: Never    Passive exposure: Never   Smokeless tobacco: Never  Vaping Use   Vaping Use: Never used  Substance and Sexual Activity   Alcohol use: Yes    Comment: socially   Drug use: No   Sexual activity: Not on file  Other Topics Concern   Not on file  Social History Narrative   Not on file   Social Determinants of Health   Financial  Resource Strain: Not on file  Food Insecurity: Not on file  Transportation Needs: Not on file  Physical Activity: Not on file  Stress: Not on file  Social Connections: Not on file  Intimate Partner Violence: Not on file    Family History  Problem Relation Age of Onset   Diabetes Mother    High blood pressure Mother    Diabetes Father    High blood pressure Father     Past Surgical History:  Procedure Laterality Date   KNEE ARTHROSCOPY Right 09/30/2022   Procedure: ARTHROSCOPIC IRRIGATION AND DEBRIDEMENT RIGHT KNEE;  Surgeon: Sheral Apley, MD;  Location: [facility] OR;  Service: Orthopedics;  Laterality: Right;    ROS: Review of Systems Negative except as stated above  PHYSICAL EXAM: BP 122/83 (BP Location: Left Arm, Patient Position: Sitting,  Cuff Size: Normal)   Pulse 83   Temp 98.6 F (37 C) (Oral)   Ht 5\' 8"  (1.727 m)   Wt 164 lb (74.4 kg)   SpO2 97%   BMI 24.94 kg/m   Physical Exam   General appearance - alert, well appearing, middle age AAM and in no distress Mental status - normal mood, behavior, speech, dress, motor activity, and thought processes Chest - clear to auscultation, no wheezes, rales or rhonchi, symmetric air entry Heart - normal rate, regular rhythm, normal S1, S2, no murmurs, rubs, clicks or gallops Musculoskeletal - mild swelling with palpable effusion RT knee Extremities - peripheral pulses normal, no pedal edema, no clubbing or cyanosis     Latest Ref Rng & Units 03/22/2023    4:52 PM 12/29/2022   11:00 AM 09/06/2022    6:03 PM  CMP  Glucose 65 - 99 mg/dL 161  096  93   BUN 7 - 25 mg/dL 19  15  12    Creatinine 0.60 - 1.26 mg/dL 0.45  4.09  8.11   Sodium 135 - 146 mmol/L 140  142  139   Potassium 3.5 - 5.3 mmol/L 4.3  4.4  4.4   Chloride 98 - 110 mmol/L 101  100  97   CO2 20 - 32 mmol/L 30  34  30   Calcium 8.6 - 10.3 mg/dL 8.5  9.2  9.4   Total Protein 6.1 - 8.1 g/dL 7.0     Total Bilirubin 0.2 - 1.2 mg/dL 0.5     AST 10 - 40 U/L 22     ALT 9 - 46 U/L 20      Lipid Panel  No results found for: "CHOL", "TRIG", "HDL", "CHOLHDL", "VLDL", "LDLCALC", "LDLDIRECT"  CBC    Component Value Date/Time   WBC 5.5 03/22/2023 1652   RBC 4.24 03/22/2023 1652   HGB 13.5 03/22/2023 1652   HCT 40.0 03/22/2023 1652   PLT 331 03/22/2023 1652   MCV 94.3 03/22/2023 1652   MCH 31.8 03/22/2023 1652   MCHC 33.8 03/22/2023 1652   RDW 12.6 03/22/2023 1652    ASSESSMENT AND PLAN:  1. Mild intermittent asthma without complication - albuterol (VENTOLIN HFA) 108 (90 Base) MCG/ACT inhaler; Inhale 2 puffs into the lungs every 4 (four) hours as needed for wheezing or shortness of breath.  Dispense: 1 each; Refill: 6  2. Lyme arthritis (HCC) Continues to improve.  Off antibiotics.  Needs follow-up with Ortho  to see whether they can remove some of the fluid from his knee.  3. Elevated blood-pressure reading without diagnosis of hypertension Blood pressure has improved since last visit with me.  Encouraged him to continue DASH diet and try to check the blood pressure at least once a week.  4. On pre-exposure prophylaxis for HIV On Prep   Patient was given the opportunity to ask questions.  Patient verbalized understanding of the plan and was able to repeat key elements of the plan.   This documentation was completed using Paediatric nurse.  Any transcriptional errors are unintentional.  No orders of the defined types were placed in this encounter.    Requested Prescriptions    No prescriptions requested or ordered in this encounter    No follow-ups on file.  Jonah Blue, MD, FACP

## 2023-10-04 ENCOUNTER — Ambulatory Visit: Payer: Medicaid Other | Admitting: Internal Medicine

## 2024-05-18 ENCOUNTER — Encounter (HOSPITAL_COMMUNITY): Payer: Self-pay

## 2024-05-18 ENCOUNTER — Other Ambulatory Visit: Payer: Self-pay

## 2024-05-18 ENCOUNTER — Emergency Department (HOSPITAL_COMMUNITY)
Admission: EM | Admit: 2024-05-18 | Discharge: 2024-05-18 | Disposition: A | Discharge status: Home / Self Care | Payer: Self-pay | Attending: Emergency Medicine | Admitting: Emergency Medicine

## 2024-05-18 ENCOUNTER — Emergency Department (HOSPITAL_COMMUNITY): Payer: Self-pay

## 2024-05-18 DIAGNOSIS — F12929 Cannabis use, unspecified with intoxication, unspecified: Secondary | ICD-10-CM

## 2024-05-18 DIAGNOSIS — F12920 Cannabis use, unspecified with intoxication, uncomplicated: Secondary | ICD-10-CM | POA: Insufficient documentation

## 2024-05-18 DIAGNOSIS — R079 Chest pain, unspecified: Secondary | ICD-10-CM | POA: Diagnosis present

## 2024-05-18 LAB — CBC
HCT: 42 % (ref 39.0–52.0)
Hemoglobin: 13.8 g/dL (ref 13.0–17.0)
MCH: 32.8 pg (ref 26.0–34.0)
MCHC: 32.9 g/dL (ref 30.0–36.0)
MCV: 99.8 fL (ref 80.0–100.0)
Platelets: 228 10*3/uL (ref 150–400)
RBC: 4.21 MIL/uL — ABNORMAL LOW (ref 4.22–5.81)
RDW: 12.5 % (ref 11.5–15.5)
WBC: 10 10*3/uL (ref 4.0–10.5)
nRBC: 0 % (ref 0.0–0.2)

## 2024-05-18 LAB — BASIC METABOLIC PANEL WITH GFR
Anion gap: 10 (ref 5–15)
BUN: 14 mg/dL (ref 6–20)
CO2: 29 mmol/L (ref 22–32)
Calcium: 9 mg/dL (ref 8.9–10.3)
Chloride: 99 mmol/L (ref 98–111)
Creatinine, Ser: 1.09 mg/dL (ref 0.61–1.24)
GFR, Estimated: >60 mL/min (ref >60)
Glucose, Bld: 143 mg/dL — ABNORMAL HIGH (ref 70–99)
Potassium: 4 mmol/L (ref 3.5–5.1)
Sodium: 138 mmol/L (ref 135–145)

## 2024-05-18 LAB — TROPONIN I (HIGH SENSITIVITY): Troponin I (High Sensitivity): 2 ng/L (ref <18)

## 2024-05-18 MED ORDER — SODIUM CHLORIDE 0.9 % IV BOLUS
1000.0000 mL | Freq: Once | INTRAVENOUS | Status: AC
  Administered 2024-05-18: 1000 mL via INTRAVENOUS

## 2024-05-18 MED ORDER — LORAZEPAM 2 MG/ML IJ SOLN
1.0000 mg | Freq: Once | INTRAMUSCULAR | Status: AC
  Administered 2024-05-18: 1 mg via INTRAVENOUS
  Filled 2024-05-18: qty 1

## 2024-05-18 NOTE — ED Provider Notes (Signed)
 [place] EMERGENCY DEPARTMENT AT [facility] Provider Note   CSN: [id number] Arrival date & time: 05/18/24  2051     History  Chief Complaint  Patient presents with   Drug Problem    Brady Rivera is a 38 y.o. male with overall noncontributory past medical history presents with concern for drug issue after taking some kind of an edible that he thought was marijuana.  Instead of it calming him down like normal he reports feeling anxious, jittery, twitching with his hands.  He does endorse some mild chest pain.  He denies any shortness of breath.  He does not think that it was laced with anything but he cannot tell exactly what he was taking.  HPI     Home Medications Prior to Admission medications   Medication Sig Start Date End Date Taking? Authorizing Provider  albuterol  (VENTOLIN  HFA) 108 (90 Base) MCG/ACT inhaler Inhale 2 puffs into the lungs every 4 (four) hours as needed for wheezing or shortness of breath. 05/03/23   Lawrance Presume, MD  diphenhydrAMINE (BENADRYL) 25 MG tablet Take 50 mg by mouth every 6 (six) hours as needed for allergies.    [provider]  emtricitabine-tenofovir (TRUVADA) 200-300 MG tablet Take 1 tablet by mouth daily. 04/05/23   [provider]  fluticasone (FLONASE) 50 MCG/ACT nasal spray Place 2 sprays into both nostrils daily as needed for allergies or rhinitis.    [provider]  Menthol, Topical Analgesic, (ICY HOT EX) Apply 1 Application topically daily as needed.    [provider]  Menthol-Methyl Salicylate (MUSCLE RUB) 10-15 % CREA Apply 1 Application topically as needed for muscle pain.    [provider]      Allergies    Patient has no known allergies.    Review of Systems   Review of Systems  All other systems reviewed and are negative.   Physical Exam Updated Vital Signs BP (!) 147/105 (BP Location: Left Arm)   Pulse (!) 107   Temp 97.8 F (36.6 C) (Oral)   Resp 18    Ht 5\' 8"  (1.727 m)   Wt 74.8 kg   SpO2 100%   BMI 25.09 kg/m  Physical Exam Vitals and nursing note reviewed.  Constitutional:      General: He is not in acute distress.    Appearance: Normal appearance.  HENT:     Head: Normocephalic and atraumatic.  Eyes:     General:        Right eye: No discharge.        Left eye: No discharge.  Cardiovascular:     Rate and Rhythm: Regular rhythm. Tachycardia present.     Heart sounds: No murmur heard.    No friction rub. No gallop.  Pulmonary:     Effort: Pulmonary effort is normal.     Breath sounds: Normal breath sounds.  Abdominal:     General: Bowel sounds are normal.     Palpations: Abdomen is soft.  Skin:    General: Skin is warm and dry.     Capillary Refill: Capillary refill takes less than 2 seconds.  Neurological:     Mental Status: He is alert and oriented to person, place, and time.  Psychiatric:        Mood and Affect: Mood normal.        Behavior: Behavior normal.     Comments: Anxious, but otherwise in no acute distress     ED Results /  Procedures / Treatments   Labs (all labs ordered are listed, but only abnormal results are displayed) Labs Reviewed  BASIC METABOLIC PANEL WITH GFR - Abnormal; Notable for the following components:      Result Value   Glucose, Bld 143 (*)    All other components within normal limits  CBC - Abnormal; Notable for the following components:   RBC 4.21 (*)    All other components within normal limits  RAPID URINE DRUG SCREEN, HOSP PERFORMED  TROPONIN I (HIGH SENSITIVITY)    EKG None  Radiology DG Chest 2 View Result Date: 05/18/2024 CLINICAL DATA:  Chest pain EXAM: CHEST - 2 VIEW COMPARISON:  08/01/2013 FINDINGS: The heart size and mediastinal contours are within normal limits. Both lungs are clear. The visualized skeletal structures are unremarkable. Linear density projects over the right upper quadrant of the abdomen, likely external to the patient. IMPRESSION: Normal study.  Electronically Signed   By: Janeece Mechanic M.D.   On: 05/18/2024 21:37    Procedures Procedures    Medications Ordered in ED Medications  sodium chloride  0.9 % bolus 1,000 mL (1,000 mLs Intravenous New Bag/Given 05/18/24 2122)  LORazepam (ATIVAN) injection 1 mg (1 mg Intravenous Given 05/18/24 2119)    ED Course/ Medical Decision Making/ A&P                                 Medical Decision Making  This patient is a 38 y.o. male  who presents to the ED for concern of cannabis ingestion, anxiety, generalized tingling.  Endorse some brief chest pain but thinks that it is secondary to anxiety.  Differential diagnoses prior to evaluation: The emergent differential diagnosis includes, but is not limited to, intoxication reaction from cannabis, stress response. This is not an exhaustive differential.   Past Medical History / Co-morbidities / Social History: Overall noncontributory  Physical Exam: Physical exam performed. The pertinent findings include: Somewhat tachycardic on arrival improved on reevaluation, somewhat jittery initially, anxious, denies any SI, HI, improved on reevaluation.  Blood pressure elevated 147/105 initially.  Lab Tests/Imaging studies: I personally interpreted labs/imaging and the pertinent results include: CBC unremarkable, BMP overall unremarkable, initial troponin negative, do not think that delta is warranted in this atypical presentation, no significant cardiac risk factors.  I independently interpreted plain film chest x-ray shows no evidence of acute intrathoracic abnormality.. I agree with the radiologist interpretation.   Medications: I ordered medication including fluid bolus, Ativan, patient feeling improved after reassessment.  I have reviewed the patients home medicines and have made adjustments as needed.   Disposition: After consideration of the diagnostic results and the patients response to treatment, I feel that patient with some anxiety after  marijuana ingestion, observed for several hours in the emergency department, feeling improved after fluid bolus, Ativan, no signs of any acute abnormality, lab work reassuring, stable for discharge  emergency department workup does not suggest an emergent condition requiring admission or immediate intervention beyond what has been performed at this time. The plan is: as above. The patient is safe for discharge and has been instructed to return immediately for worsening symptoms, change in symptoms or any other concerns.  Final Clinical Impression(s) / ED Diagnoses Final diagnoses:  Cannabis use with intoxication [facility])    Rx / DC Orders ED Discharge Orders     None         Nelly Banco, PA-C 05/18/24 2316  Kingsley, Victoria K, DO 05/18/24 2329

## 2024-05-18 NOTE — Discharge Instructions (Signed)
 Your workup today was reassuring, I would stop using any marijuana or THC containing products considering the reaction that you had tonight.  Please drink plenty of fluids follow-up with your primary care doctor if you feel like you are still having any lingering anxiousness or other abnormal sensation after waking up in the morning.

## 2024-05-18 NOTE — ED Triage Notes (Signed)
 BIBA from home.  Ate a gummy, unsure about what and how much drugs were in it.  He says it normally calms him down but this one amped himm up more than usual.  Pain/tingle in left hand 6/10 pain BP 150/80 HR 108 O2 98% CBG 130

## 2024-08-08 ENCOUNTER — Telehealth: Payer: Self-pay | Admitting: Internal Medicine

## 2024-08-08 NOTE — Telephone Encounter (Signed)
 Called patient, no answer. Left voicemail confirming upcoming appointment on 08/12/2024 Provided callback number for any questions or changes.

## 2024-08-12 ENCOUNTER — Ambulatory Visit: Admitting: Internal Medicine

## 2024-08-12 ENCOUNTER — Encounter: Payer: Self-pay | Admitting: Physician Assistant

## 2024-08-12 ENCOUNTER — Telehealth: Payer: Self-pay

## 2024-08-12 ENCOUNTER — Ambulatory Visit: Payer: Self-pay | Admitting: Physician Assistant

## 2024-08-12 VITALS — BP 148/95 | HR 97 | Ht 68.0 in | Wt 156.0 lb

## 2024-08-12 DIAGNOSIS — R030 Elevated blood-pressure reading, without diagnosis of hypertension: Secondary | ICD-10-CM

## 2024-08-12 DIAGNOSIS — R739 Hyperglycemia, unspecified: Secondary | ICD-10-CM

## 2024-08-12 DIAGNOSIS — N5089 Other specified disorders of the male genital organs: Secondary | ICD-10-CM

## 2024-08-12 LAB — POCT GLYCOSYLATED HEMOGLOBIN (HGB A1C): Hemoglobin A1C: 5.6 % (ref 4.0–5.6)

## 2024-08-12 NOTE — Patient Instructions (Addendum)
 VISIT SUMMARY:  Today, you came in because you noticed a lump in your right testicle. We also checked your blood pressure and discussed your previous lab results showing elevated blood sugar.  YOUR PLAN:  -RIGHT TESTICULAR MASS: A small, veiny mass was found in your right testicle. This could be due to various reasons, and we need to investigate further. We will schedule an ultrasound to get a clearer picture of what is going on.   -ELEVATED BLOOD PRESSURE: Your blood pressure was 148/95 mmHg, which is higher than normal. High blood pressure can lead to serious health issues if not managed. We recommend you monitor your blood pressure at home daily until your follow-up appointment with Doctor Vicci.  -HYPERGLYCEMIA: Your previous lab results showed that your blood sugar was slightly elevated. High blood sugar can be a sign of diabetes or prediabetes. We will check your A1c level today to get more information.  Swelling in the Scrotum: What to Know  Your scrotum is the sac that holds your testicles, blood vessels, and the structures that send out sperm. In some cases, your scrotum may swell or grow bigger. This can happen on one or both sides of your scrotum. Many things can cause scrotal swelling. These include: A hydrocele. This is fluid around your testicle. A hernia. This is a weak area in the muscles around your groin. A vein around your testicle that has grown too big. An injury or infection. Certain treatments, such as a surgery or procedure on your genitals. Certain conditions, such as heart failure. Testicular torsion. This is a twisting of the cord that carries sperm to the testicle. Testicular cancer. You may have pain in your scrotum as well as swelling. Follow these instructions at home: Managing pain, stiffness, and swelling Use ice or an ice pack as told. Place a towel between your skin and the ice. Leave the ice on for 20 minutes, 2-3 times a day. Remove the ice if your  skin turns bright red. This is very important. If you cannot feel pain, heat, or cold, you have a greater risk of damage to the area. If your skin turns red, take off the ice right away to prevent skin damage. The risk of damage is higher if you can't feel pain, heat, or cold. For support and comfort: Put a rolled towel under your testicles. Use underwear with a supportive pouch. Wear an athletic support cup. Wear a jock strap. Activity Rest as told. Lie down when you can. Ask if it's OK for you to lift. Avoid sex until your health care provider says it's OK. General instructions Take your medicines only as told. Do a monthly self-exam of your scrotum and penis. Feel for changes. Keep all follow-up visits. Your provider will check your healing. Contact a health care provider if: Your scrotum feels heavy or you feel fluid inside. You have pain or burning while you pee. You have blood in your pee or semen. You feel a lump around your testicle. You see that one testicle is a lot larger than the other. You have sudden pain or a dull ache in your groin or scrotum that won't go away. Get help right away if: The pain gets very bad. You have a fever or chills. You keep throwing up. One or both sides of your scrotum are very red and swollen. There is redness spreading upward from your scrotum to your belly or downward from your scrotum to your thighs. This information is not intended to  replace advice given to you by your health care provider. Make sure you discuss any questions you have with your health care provider. Document Revised: 07/21/2023 Document Reviewed: 07/21/2023 Elsevier Patient Education  2024 [employer].

## 2024-08-12 NOTE — Telephone Encounter (Signed)
 Patient was scheduled for US  appointment Monday 9.8.2025 at 2pm Per patient this time works best.

## 2024-08-12 NOTE — Progress Notes (Unsigned)
 Established Patient Office Visit  Subjective   Patient ID: Brady Rivera, male    DOB: [birth year]  Age: 38 y.o. MRN: [id number]  Chief Complaint  Patient presents with   Mass    Right testicle, Patient noticed a few months ago. Patient feels it has gotten larger, Sizing described as equivalent as a nickel.    Discussed the use of AI scribe software for clinical note transcription with the patient, who gave verbal consent to proceed.  History of Present Illness   Brady Rivera is a 38 year old male who presents with a lump in his right testicle.  He noticed the lump a couple of months ago, initially feeling pressure while putting on a [employer]. The lump is sometimes painful, especially when sitting, and there has been recent swelling. There are no sores, discharge, or changes in urination.    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/01/2023   On pre-exposure prophylaxis for HIV 12/29/2022   Screening for HIV (human immunodeficiency virus) 12/29/2022   Social History   Socioeconomic History   Marital status: Single    Spouse name: Not on file   Number of children: Not on file   Years of education: Not on file   Highest education level: Not on file  Occupational History   Not on file  Tobacco Use   Smoking status: Never    Passive exposure: Never   Smokeless tobacco: Never  Vaping Use   Vaping status: Never Used  Substance and Sexual Activity   Alcohol use: Yes    Comment: socially   Drug use: Yes   Sexual activity: Not on file  Other Topics Concern   Not on file  Social History Narrative   Not on file   Social Drivers of Health   Financial Resource Strain: Not on file  Food Insecurity: Not on file  Transportation Needs: Not on file  Physical Activity: Not on file  Stress: Not on file  Social Connections: Not on  file  Intimate Partner Violence: Not on file   Family History  Problem Relation Age of Onset   Diabetes Mother    High blood pressure Mother    Diabetes Father    High blood pressure Father    No Known Allergies  Review of Systems  Constitutional:  Negative for chills and fever.  HENT: Negative.    Eyes: Negative.   Respiratory:  Negative for shortness of breath.   Cardiovascular:  Negative for chest pain.  Gastrointestinal: Negative.   Genitourinary:  Negative for dysuria, frequency and hematuria.  Musculoskeletal: Negative.   Skin: Negative.   Neurological: Negative.   Endo/Heme/Allergies: Negative.   Psychiatric/Behavioral: Negative.        Objective:     BP (!) 148/95 (BP Location: Left Arm, Patient Position: Sitting, Cuff Size: Large)   Pulse 97   Ht 5' 8 (1.727 m)   Wt 156 lb (70.8 kg)   SpO2 98%   BMI 23.72 kg/m  BP Readings from Last 3 Encounters:  08/12/24 (!) 148/95  05/18/24 (!) 127/90  05/03/23 122/83   Wt Readings from Last 3 Encounters:  08/12/24 156 lb (70.8 kg)  05/18/24 165 lb (74.8 kg)  05/03/23 164 lb (74.4 kg)    Physical Exam Vitals and nursing note reviewed. Exam  conducted with a chaperone present.  Constitutional:      Appearance: Normal appearance.  HENT:     Head: Normocephalic and atraumatic.     Right Ear: External ear normal.     Left Ear: External ear normal.     Nose: Nose normal.     Mouth/Throat:     Mouth: Mucous membranes are moist.     Pharynx: Oropharynx is clear.  Eyes:     Extraocular Movements: Extraocular movements intact.     Conjunctiva/sclera: Conjunctivae normal.     Pupils: Pupils are equal, round, and reactive to light.  Cardiovascular:     Rate and Rhythm: Normal rate and regular rhythm.     Pulses: Normal pulses.     Heart sounds: Normal heart sounds.  Pulmonary:     Effort: Pulmonary effort is normal.     Breath sounds: Normal breath sounds.  Genitourinary:    Testes:        Right: Swelling and  varicocele present. Tenderness not present.     Epididymis:     Right: Normal.     Left: Normal.  Musculoskeletal:        General: Normal range of motion.     Cervical back: Normal range of motion and neck supple.  Skin:    General: Skin is warm and dry.  Neurological:     General: No focal deficit present.     Mental Status: He is alert and oriented to person, place, and time.  Psychiatric:        Mood and Affect: Mood normal.        Behavior: Behavior normal.        Thought Content: Thought content normal.        Judgment: Judgment normal.         Assessment & Plan:   Problem List Items Addressed This Visit   None Visit Diagnoses       Elevated random blood glucose level    -  Primary   Relevant Orders   POCT glycosylated hemoglobin (Hb A1C) (Completed)     Mass of right testicle       Relevant Orders   US  Scrotum     Elevated blood pressure reading in office without diagnosis of hypertension           Assessment and Plan  Right testicular mass/ R/O variocele Presence of a small, veiny mass in the right testicle. - Order ultrasound of the right testicle.   Elevated blood pressure Blood pressure at 148/95 mmHg, indicating elevated diastolic pressure. Discussed home monitoring to assess persistence or situational factors. - Encourage daily home blood pressure monitoring until follow-up with Doctor Vicci.  Hyperglycemia Previous labs showed slightly elevated blood sugar. Discussed checking A1c for diabetes or prediabetes assessment. - Check A1c level today A1C 5.6    I have reviewed the patient's medical history (PMH, PSH, Social History, Family History, Medications, and allergies) , and have been updated if relevant. I spent 30 minutes reviewing chart and  face to face time with patient.    I have reviewed the patient's medical history (PMH, PSH, Social History, Family History, Medications, and allergies) , and have been updated if relevant. I spent 30  minutes reviewing chart and  face to face time with patient.    . Return if symptoms worsen or fail to improve.    Kirk RAMAN Mayers, PA-C

## 2024-08-13 ENCOUNTER — Encounter: Payer: Self-pay | Admitting: Physician Assistant

## 2024-08-18 ENCOUNTER — Ambulatory Visit (HOSPITAL_COMMUNITY): Admission: RE | Admit: 2024-08-18 | Payer: Self-pay | Source: Ambulatory Visit

## 2024-08-25 ENCOUNTER — Telehealth: Payer: Self-pay | Admitting: Internal Medicine

## 2024-08-25 NOTE — Telephone Encounter (Signed)
 Confirmed appt for 9/16

## 2024-08-26 ENCOUNTER — Ambulatory Visit: Admitting: Internal Medicine
